# Patient Record
Sex: Female | Born: 1985 | Race: Black or African American | Hispanic: No | Marital: Single | Smoking: Never smoker
Health system: Southern US, Community
[De-identification: ages and names within clinical notes are randomized; demographics above are authoritative.]

## PROBLEM LIST (undated history)

## (undated) DIAGNOSIS — G43909 Migraine, unspecified, not intractable, without status migrainosus: Secondary | ICD-10-CM

---

## 2009-01-10 ENCOUNTER — Emergency Department (HOSPITAL_COMMUNITY): Admission: EM | Admit: 2009-01-10 | Discharge: 2009-01-10 | Payer: Self-pay | Admitting: Emergency Medicine

## 2014-04-02 ENCOUNTER — Encounter (HOSPITAL_COMMUNITY): Payer: Self-pay | Admitting: Emergency Medicine

## 2014-04-02 ENCOUNTER — Emergency Department (HOSPITAL_COMMUNITY)
Admission: EM | Admit: 2014-04-02 | Discharge: 2014-04-02 | Disposition: A | Discharge status: Home / Self Care | Payer: BC Managed Care – PPO | Attending: Emergency Medicine | Admitting: Emergency Medicine

## 2014-04-02 ENCOUNTER — Emergency Department (HOSPITAL_COMMUNITY): Payer: BC Managed Care – PPO

## 2014-04-02 DIAGNOSIS — F17200 Nicotine dependence, unspecified, uncomplicated: Secondary | ICD-10-CM | POA: Insufficient documentation

## 2014-04-02 DIAGNOSIS — J069 Acute upper respiratory infection, unspecified: Secondary | ICD-10-CM | POA: Insufficient documentation

## 2014-04-02 DIAGNOSIS — R51 Headache: Secondary | ICD-10-CM | POA: Insufficient documentation

## 2014-04-02 DIAGNOSIS — Z8679 Personal history of other diseases of the circulatory system: Secondary | ICD-10-CM | POA: Insufficient documentation

## 2014-04-02 HISTORY — DX: Migraine, unspecified, not intractable, without status migrainosus: G43.909

## 2014-04-02 LAB — RAPID STREP SCREEN (MED CTR MEBANE ONLY): STREPTOCOCCUS, GROUP A SCREEN (DIRECT): NEGATIVE

## 2014-04-02 MED ORDER — IBUPROFEN 800 MG PO TABS
800.0000 mg | ORAL_TABLET | Freq: Once | ORAL | Status: AC
  Administered 2014-04-02: 800 mg via ORAL
  Filled 2014-04-02: qty 1

## 2014-04-02 NOTE — ED Notes (Signed)
Per pt, states cold symptoms since last Friday-productive cough-no relief with OTC meds

## 2014-04-02 NOTE — ED Provider Notes (Signed)
CSN: [id number]     Arrival date & time 04/02/14  0913 History   First MD Initiated Contact with Patient 04/02/14 [phone]     Chief Complaint  Patient presents with  . Sore Throat  . Cough     (Consider location/radiation/quality/duration/timing/severity/associated sxs/prior Treatment) Patient is a 28 y.o. female presenting with pharyngitis, cough, and URI. The history is provided by the patient.  Sore Throat This is a new problem. The current episode started more than 2 days ago. The problem occurs constantly. The problem has not changed since onset.Associated symptoms include headaches. Pertinent negatives include no chest pain, no abdominal pain and no shortness of breath. Nothing aggravates the symptoms. Nothing relieves the symptoms.  Cough Cough characteristics:  Productive Sputum characteristics:  Yellow Severity:  Mild Smoker: occasional - not daily.   Relieved by:  Nothing Worsened by:  Nothing tried Associated symptoms: headaches, rhinorrhea and sore throat   Associated symptoms: no chest pain, no fever and no shortness of breath   URI Presenting symptoms: congestion, cough, facial pain, rhinorrhea and sore throat   Presenting symptoms: no fever   Cough:    Cough characteristics:  Productive   Sputum characteristics:  Yellow   Severity:  Mild   Onset quality:  Gradual   Duration:  4 days   Timing:  Constant   Progression:  Unchanged   Chronicity:  New Associated symptoms: headaches     Past Medical History  Diagnosis Date  . Migraines    History reviewed. No pertinent past surgical history. No family history on file. History  Substance Use Topics  . Smoking status: Current Some Day Smoker  . Smokeless tobacco: Not on file  . Alcohol Use: Yes   OB History   Grav Para Term Preterm Abortions TAB SAB Ect Mult Living                 Review of Systems  Constitutional: Negative for fever.  HENT: Positive for congestion, rhinorrhea and sore throat.    Respiratory: Positive for cough. Negative for shortness of breath.   Cardiovascular: Negative for chest pain.  Gastrointestinal: Negative for abdominal pain.  Neurological: Positive for headaches.  All other systems reviewed and are negative.     Allergies  Review of patient's allergies indicates no known allergies.  Home Medications   Prior to Admission medications   Medication Sig Start Date End Date Taking? Authorizing Provider  acetaminophen (TYLENOL) 500 MG tablet Take 1,000-1,500 mg by mouth every 6 (six) hours as needed for moderate pain.   Yes Historical Provider, MD  ibuprofen (ADVIL,MOTRIN) 200 MG tablet Take 400 mg by mouth every 6 (six) hours as needed for mild pain.   Yes Historical Provider, MD   BP 114/77  Pulse 73  Temp(Src) 98.5 F (36.9 C) (Oral)  Resp 16  SpO2 100%  LMP 03/19/2014 Physical Exam  Nursing note and vitals reviewed. Constitutional: She is oriented to person, place, and time. She appears well-developed and well-nourished. No distress.  HENT:  Head: Normocephalic and atraumatic.  Right Ear: Tympanic membrane normal.  Left Ear: Tympanic membrane normal.  Nose: No mucosal edema, rhinorrhea or nasal septal hematoma. No epistaxis.  No foreign bodies. Right sinus exhibits maxillary sinus tenderness and frontal sinus tenderness. Left sinus exhibits maxillary sinus tenderness and frontal sinus tenderness.  Mouth/Throat: Oropharynx is clear and moist. No oropharyngeal exudate.  Eyes: EOM are normal. Pupils are equal, round, and reactive to light.  Neck: Normal range of motion. Neck supple.  Cardiovascular: Normal rate and regular rhythm.  Exam reveals no friction rub.   No murmur heard. Pulmonary/Chest: Effort normal and breath sounds normal. No respiratory distress. She has no wheezes. She has no rales.  Abdominal: Soft. She exhibits no distension. There is no tenderness. There is no rebound.  Musculoskeletal: Normal range of motion. She exhibits no  edema.  Neurological: She is alert and oriented to person, place, and time.  Skin: No rash noted. She is not diaphoretic.    ED Course  Procedures (including critical care time) Labs Review Labs Reviewed  RAPID STREP SCREEN    Imaging Review No results found.   EKG Interpretation None      MDM   Final diagnoses:  URI (upper respiratory infection)    53F presents with URI symptoms. Roommate with similar symptoms. No fevers. Rhinorrhea, sinus congestion, facial pain, mild productive cough. No SOB. Mild throat pain. AFVSS here. Lungs clear. MMM, oropharynx clear - no redness. TMs clear. Sinus tenderness on palpation. Likely viral sinusitis/URI. Given motrin, will check CXR. No need for antibiotics. Given resource guide for f/u.   Dagmar HaitWilliam Chrysta Fulcher, MD 04/02/14 1041

## 2014-04-02 NOTE — ED Notes (Signed)
Patient transported to X-ray 

## 2014-04-02 NOTE — Discharge Instructions (Signed)

## 2014-04-04 LAB — CULTURE, GROUP A STREP

## 2014-05-30 ENCOUNTER — Emergency Department (HOSPITAL_COMMUNITY)
Admission: EM | Admit: 2014-05-30 | Discharge: 2014-05-30 | Disposition: A | Discharge status: Home / Self Care | Payer: BC Managed Care – PPO | Attending: Emergency Medicine | Admitting: Emergency Medicine

## 2014-05-30 ENCOUNTER — Encounter (HOSPITAL_COMMUNITY): Payer: Self-pay | Admitting: Emergency Medicine

## 2014-05-30 DIAGNOSIS — R1084 Generalized abdominal pain: Secondary | ICD-10-CM | POA: Insufficient documentation

## 2014-05-30 DIAGNOSIS — F17200 Nicotine dependence, unspecified, uncomplicated: Secondary | ICD-10-CM | POA: Insufficient documentation

## 2014-05-30 DIAGNOSIS — R112 Nausea with vomiting, unspecified: Secondary | ICD-10-CM | POA: Insufficient documentation

## 2014-05-30 DIAGNOSIS — K644 Residual hemorrhoidal skin tags: Secondary | ICD-10-CM | POA: Insufficient documentation

## 2014-05-30 DIAGNOSIS — Z8679 Personal history of other diseases of the circulatory system: Secondary | ICD-10-CM | POA: Insufficient documentation

## 2014-05-30 DIAGNOSIS — Z791 Long term (current) use of non-steroidal anti-inflammatories (NSAID): Secondary | ICD-10-CM | POA: Insufficient documentation

## 2014-05-30 DIAGNOSIS — Z3202 Encounter for pregnancy test, result negative: Secondary | ICD-10-CM | POA: Insufficient documentation

## 2014-05-30 DIAGNOSIS — R197 Diarrhea, unspecified: Secondary | ICD-10-CM | POA: Insufficient documentation

## 2014-05-30 LAB — COMPREHENSIVE METABOLIC PANEL
ALT: 17 U/L (ref 0–35)
ANION GAP: 13 (ref 5–15)
AST: 19 U/L (ref 0–37)
Albumin: 5 g/dL (ref 3.5–5.2)
Alkaline Phosphatase: 56 U/L (ref 39–117)
BILIRUBIN TOTAL: 0.7 mg/dL (ref 0.3–1.2)
BUN: 13 mg/dL (ref 6–23)
CHLORIDE: 100 meq/L (ref 96–112)
CO2: 25 meq/L (ref 19–32)
CREATININE: 0.74 mg/dL (ref 0.50–1.10)
Calcium: 10.5 mg/dL (ref 8.4–10.5)
GLUCOSE: 92 mg/dL (ref 70–99)
Potassium: 4.2 mEq/L (ref 3.7–5.3)
Sodium: 138 mEq/L (ref 137–147)
Total Protein: 8.9 g/dL — ABNORMAL HIGH (ref 6.0–8.3)

## 2014-05-30 LAB — CBC WITH DIFFERENTIAL/PLATELET
Basophils Absolute: 0 10*3/uL (ref 0.0–0.1)
Basophils Relative: 0 % (ref 0–1)
Eosinophils Absolute: 0.3 10*3/uL (ref 0.0–0.7)
Eosinophils Relative: 4 % (ref 0–5)
HEMATOCRIT: 42.1 % (ref 36.0–46.0)
Hemoglobin: 14.5 g/dL (ref 12.0–15.0)
Lymphocytes Relative: 30 % (ref 12–46)
Lymphs Abs: 2.7 10*3/uL (ref 0.7–4.0)
MCH: 31.6 pg (ref 26.0–34.0)
MCHC: 34.4 g/dL (ref 30.0–36.0)
MCV: 91.7 fL (ref 78.0–100.0)
Monocytes Absolute: 0.4 10*3/uL (ref 0.1–1.0)
Monocytes Relative: 5 % (ref 3–12)
NEUTROS ABS: 5.5 10*3/uL (ref 1.7–7.7)
Neutrophils Relative %: 61 % (ref 43–77)
Platelets: 226 10*3/uL (ref 150–400)
RBC: 4.59 MIL/uL (ref 3.87–5.11)
RDW: 12 % (ref 11.5–15.5)
WBC: 9.1 10*3/uL (ref 4.0–10.5)

## 2014-05-30 LAB — URINALYSIS, ROUTINE W REFLEX MICROSCOPIC
Bilirubin Urine: NEGATIVE
Glucose, UA: NEGATIVE mg/dL
Hgb urine dipstick: NEGATIVE
KETONES UR: NEGATIVE mg/dL
Leukocytes, UA: NEGATIVE
NITRITE: NEGATIVE
PROTEIN: NEGATIVE mg/dL
Specific Gravity, Urine: 1.021 (ref 1.005–1.030)
UROBILINOGEN UA: 0.2 mg/dL (ref 0.0–1.0)
pH: 8 (ref 5.0–8.0)

## 2014-05-30 LAB — POC OCCULT BLOOD, ED: Fecal Occult Bld: POSITIVE — AB

## 2014-05-30 LAB — PREGNANCY, URINE: PREG TEST UR: NEGATIVE

## 2014-05-30 MED ORDER — ONDANSETRON HCL 4 MG/2ML IJ SOLN
4.0000 mg | Freq: Once | INTRAMUSCULAR | Status: AC
  Administered 2014-05-30: 4 mg via INTRAVENOUS
  Filled 2014-05-30: qty 2

## 2014-05-30 MED ORDER — SODIUM CHLORIDE 0.9 % IV BOLUS (SEPSIS)
1000.0000 mL | Freq: Once | INTRAVENOUS | Status: AC
  Administered 2014-05-30: 1000 mL via INTRAVENOUS

## 2014-05-30 MED ORDER — ONDANSETRON 4 MG PO TBDP: 4.0000 mg | ORAL_TABLET | Freq: Three times a day (TID) | ORAL | Status: AC | PRN

## 2014-05-30 NOTE — Discharge Instructions (Signed)
Follow up with your primary care doctor about your hospital visit. Continue to hydrate orally.Take all medications as prescribed & use Zofran as directed for nausea & vomiting.  Read the instructions below for reasons to return to the ER.  ° °The 'BRAT' diet is suggested, then progress to diet as tolerated as symptoms abate. Call if bloody stools, persistent diarrhea, vomiting, fever or abdominal pain. °Bananas.  °Rice.  °Applesauce.  °Toast (and other simple starches such as crackers, potatoes, noodles).  ° °SEEK IMMEDIATE MEDICAL ATTENTION IF: ° °You begin having localized abdominal pain that does not go away or becomes severe (The right side could  possibly be appendicitis. In an adult, the left lower portion of the abdomen could be colitis or diverticulitis) °  °A temperature above 101 develops ° °Repeated vomiting occurs (multiple uncontrollable episodes) or you are unable to keep fluids down ° °Blood is being passed in stools or vomit (bright red or black tarry stools).  ° °Return also if you develop chest pain, difficulty breathing, dizziness or fainting, or become confused, poorly responsive, or inconsolable (young children). ° ° °RESOURCE GUIDE ° °Dental Problems ° °Patients with Medicaid: °[facility]                     [facility] °[address]                                           [address] °Phone:  [phone]                                                  Phone:  510-2600 ° °If unable to pay or uninsured, contact:  [facility] or [facility] to become qualified for the adult dental clinic. ° °Chronic Pain Problems °Contact [facility]  [phone] °Patients need to be referred by their primary care doctor. ° °Insufficient Money for Medicine °Contact [employer]:  call "211" or [facility] 271-5999. ° °No Primary Care Doctor °Call [facility]  832-8000 °Other agencies that provide inexpensive medical care °   [facility]  [phone] °   [facility]  [phone] °   [facility]  [phone] °   [facility]  832-4777 °   [facility]  [phone] °   [facility]  [phone] ° °Psychological Services °[facility]  [phone] °[facility]  [phone] °[facility]   [phone] (emergency services 641-4993) ° °Substance Abuse Resources °[facility]  336-882-2125 °[facility] 336-784-9470 °[facility] 336-285-9073 °[facility] 336-845-3988 °Residential & Outpatient Substance Abuse Program  800-659-3381 ° °Abuse/Neglect °[place] [facility] (336) 641-3795 °[place] [facility] Hotline 800-378-5315 (After Hours) ° °Emergency Shelter ° [facility] (336) 271-5985 ° °Maternity Homes °[facility] (336) 275-9566 °[facility] (704) 372-4663 ° °MRSA Hotline #:   832-7006 ° ° ° °[place] [facility] ° °[facility] [place]     [employer]                            [facility] °[address] [place]                       [address]      [address]  °[place]                                                [place]                            [place] °Phone:  349-3220                                   Phone:  [phone]                 Phone:  342-8140 ° °[facility] °Phone:  342-8316 ° °[place] [facility]line °(336) 342-1394 °(336) 342-3537 (After Hours) ° ° ° ° °

## 2014-05-30 NOTE — Progress Notes (Signed)
  CARE MANAGEMENT ED NOTE 05/30/2014  Patient:  [name] Sullivan,[name]   Account Number:  [id number]  Date Initiated:  05/30/2014  Documentation initiated by:  Edd ArbourGIBBS,Evalina Tabak  Subjective/Objective Assessment:   28 yr [place] EPIC c/o vomiting, diarrhea and abdominal period x 1 week.  Pt states has noted blood on tissue and in toilet.  ? Hemorrhoid.  Pain with bm at rectum.     Subjective/Objective Assessment Detail:   Pt confirms she no longer has BCBS coverage Pt see by [facility] staff with resources and informed CM she would follow up on the resources already offered to her by [facility] staff  Pt appreciative of services offered     Action/Plan:   ED Cm spoke with pt about pcp, encouraged f[facility] with [facility]   Action/Plan Detail:   Anticipated DC Date:       Status Recommendation to Physician:   Result of Recommendation:    Other ED Services  Consult Working Plan    DC Planning Services  Other  PCP issues  Outpatient Services - Pt will follow up    Choice offered to / List presented to:            Status of service:  Completed, signed off  ED Comments:   ED Comments Detail:

## 2014-05-30 NOTE — ED Provider Notes (Signed)
CSN: [id number]     Arrival date & time 05/30/14  1228 History   None    Chief Complaint  Patient presents with  . Emesis  . Abdominal Pain     (Consider location/radiation/quality/duration/timing/severity/associated sxs/prior Treatment) HPI Comments: Patient presents today with a chief complaint of nausea, vomiting, diarrhea, and diffuse abdominal pain.  She reports that symptoms have been present for the past week.  She states that she has approximately 2-3 episodes of vomiting daily.  No blood in her emesis.  She reports that she was having 3-4 episodes of diarrhea daily, but this resolved 3 days ago.   She states that she has noticed a small amount of bright red blood while wiping after a BM the past 2 days.  She reports that she has also had diffuse abdominal pain.  She describes the pain as an "upset stomach" and also states that she has some cramping.  Pain does not radiate.  She denies fever, chills, urinary symptoms, or vaginal discharge.  She has not been taking anything for her symptoms.  She denies any known sick contacts or foreign travel.  Patient is a 28 y.o. female presenting with vomiting and abdominal pain. The history is provided by the patient.  Emesis Associated symptoms: abdominal pain and diarrhea   Associated symptoms: no chills   Abdominal Pain Associated symptoms: diarrhea, nausea and vomiting   Associated symptoms: no chills and no fever     Past Medical History  Diagnosis Date  . Migraines    History reviewed. No pertinent past surgical history. History reviewed. No pertinent family history. History  Substance Use Topics  . Smoking status: Current Some Day Smoker  . Smokeless tobacco: Not on file  . Alcohol Use: Yes   OB History   Grav Para Term Preterm Abortions TAB SAB Ect Mult Living                 Review of Systems  Constitutional: Negative for fever and chills.  Gastrointestinal: Positive for nausea, vomiting, abdominal pain, diarrhea and  blood in stool.  All other systems reviewed and are negative.     Allergies  Review of patient's allergies indicates no known allergies.  Home Medications   Prior to Admission medications   Medication Sig Start Date End Date Taking? Authorizing Provider  acetaminophen (TYLENOL) 500 MG tablet Take 1,000 mg by mouth every 6 (six) hours as needed for moderate pain.    Yes Historical Provider, MD  ibuprofen (ADVIL,MOTRIN) 200 MG tablet Take 400 mg by mouth every 6 (six) hours as needed for mild pain or moderate pain.    Yes Historical Provider, MD  naproxen sodium (ANAPROX) 220 MG tablet Take 660 mg by mouth 2 (two) times daily as needed (pain).   Yes Historical Provider, MD   BP 115/76  Pulse 97  Temp(Src) 98.4 F (36.9 C) (Oral)  Resp 16  SpO2 100%  LMP 04/07/2014 Physical Exam  Nursing note and vitals reviewed. Constitutional: She appears well-developed and well-nourished.  HENT:  Head: Normocephalic and atraumatic.  Mouth/Throat: Oropharynx is clear and moist.  Neck: Normal range of motion. Neck supple.  Cardiovascular: Normal rate, regular rhythm and normal heart sounds.   Pulmonary/Chest: Effort normal and breath sounds normal.  Abdominal: Soft. Bowel sounds are normal. She exhibits no distension and no mass. There is no rebound, no guarding and negative Murphy's sign.  Mild diffuse abdominal tenderness to palpation  Genitourinary: Rectum normal. Rectal exam shows no mass and no  tenderness.  Small non thrombosed external hemorrhoid Stool appears brown in color  Neurological: She is alert.  Skin: Skin is warm and dry.  Psychiatric: She has a normal mood and affect.    ED Course  Procedures (including critical care time) Labs Review Labs Reviewed  COMPREHENSIVE METABOLIC PANEL - Abnormal; Notable for the following:    Total Protein 8.9 (*)    All other components within normal limits  URINALYSIS, ROUTINE W REFLEX MICROSCOPIC - Abnormal; Notable for the following:     APPearance CLOUDY (*)    All other components within normal limits  CBC WITH DIFFERENTIAL  PREGNANCY, URINE  URINE MICROSCOPIC-ADD ON  POC OCCULT BLOOD, ED    Imaging Review No results found.   EKG Interpretation None     4:16 PM Reassessed patient.  She reports that her nausea has improved.  Will fluid challenge and reassess.  Repeat Abdominal Exam:  Abdomen soft and nontender. 4:49 PM Reassessed patient.  She is tolerating PO liquids. MDM   Final diagnoses:  None  Patient presenting with nausea, vomiting, diarrhea, and diffuse abdominal cramping.  Patient afebrile.  Labs unremarkable.  Urine pregnancy negative.  No localized abdominal pain on exam.  No rebound or guarding.  Therefore, doubt surgical abdomen.  Nausea improved after given IV Zofran.  Patient able to tolerate PO liquids.  Feel that the patient is stable for discharge.  Return precautions given.    Santiago Glad, PA-C 05/30/14 2230

## 2014-05-30 NOTE — Progress Notes (Signed)
[facility] provided pt with a list of primary care resources to help patient establish primary care.  °

## 2014-05-30 NOTE — ED Notes (Signed)
Pt states having vomiting, diarrhea and abdominal period x 1 week.  Pt states has noted blood on tissue and in toilet.  ? Hemorrhoid.  Pain with bm at rectum.

## 2014-05-31 NOTE — ED Provider Notes (Signed)
Medical screening examination/treatment/procedure(s) were performed by non-physician practitioner and as supervising physician I was immediately available for consultation/collaboration.   EKG Interpretation None       Linnie Delgrande T Ayoub Arey, MD 05/31/14 1311 

## 2014-10-15 ENCOUNTER — Encounter (HOSPITAL_COMMUNITY): Payer: Self-pay | Admitting: *Deleted

## 2014-10-16 ENCOUNTER — Ambulatory Visit (HOSPITAL_COMMUNITY)
Admission: RE | Admit: 2014-10-16 | Discharge: 2014-10-16 | Disposition: A | Discharge status: Home / Self Care | Payer: Self-pay | Source: Ambulatory Visit | Attending: Obstetrics and Gynecology | Admitting: Obstetrics and Gynecology

## 2014-10-16 ENCOUNTER — Encounter (HOSPITAL_COMMUNITY): Payer: Self-pay

## 2014-10-16 VITALS — BP 112/68 | Temp 98.7°F | Ht 67.5 in | Wt 114.6 lb

## 2014-10-16 DIAGNOSIS — Z1239 Encounter for other screening for malignant neoplasm of breast: Secondary | ICD-10-CM

## 2014-10-16 DIAGNOSIS — R87611 Atypical squamous cells cannot exclude high grade squamous intraepithelial lesion on cytologic smear of cervix (ASC-H): Secondary | ICD-10-CM

## 2014-10-16 HISTORY — DX: Migraine, unspecified, not intractable, without status migrainosus: G43.909

## 2014-10-16 NOTE — Patient Instructions (Addendum)
Explained to [name] Sullivan the follow-up needed for her abnormal Pap smear. [facility] for [facility] per recommendation to follow-up for abnormal Pap smear. Appointment scheduled for Thursday, October 24, 2014 at 1245. Patient aware of appointment and will be there. Informed patient she will need a screening mammogram at age 28 unless clinically indicated prior. [name] Sullivan verbalized understanding.  [name] Sullivan, [name] Maserhristine Poll, RN 8:51 AM

## 2014-10-16 NOTE — Progress Notes (Signed)
[facility] due to recommending a colposcopy to follow-up for abnormal Pap smear on 08/01/2014.  Pap Smear: Pap smear not completed today. [facility] and ASCUS cannot exclude HGSIL. Per patient her most recent Pap smear is the only abnormal Pap smear she has had. Last Pap smear result is scanned in EPIC under media.   Physical exam: Breasts Breasts symmetrical. No skin abnormalities bilateral breasts. No nipple retraction bilateral breasts. No nipple discharge bilateral breasts. No lymphadenopathy. No lumps palpated bilateral breasts. No complaints of pain or tenderness on exam. Screening mammogram recommended at age 28 unless clinically indicated prior.    Pelvic/Bimanual No Pap smear completed today since last Pap smear was 08/01/2014. Pap smear not indicated per [facility] guidelines.

## 2014-10-24 ENCOUNTER — Encounter: Payer: Self-pay | Admitting: Obstetrics & Gynecology

## 2014-10-24 ENCOUNTER — Ambulatory Visit (INDEPENDENT_AMBULATORY_CARE_PROVIDER_SITE_OTHER): Payer: Self-pay | Admitting: Obstetrics & Gynecology

## 2014-10-24 ENCOUNTER — Other Ambulatory Visit (HOSPITAL_COMMUNITY)
Admission: RE | Admit: 2014-10-24 | Discharge: 2014-10-24 | Disposition: A | Discharge status: Home / Self Care | Payer: Self-pay | Source: Ambulatory Visit | Attending: Obstetrics & Gynecology | Admitting: Obstetrics & Gynecology

## 2014-10-24 VITALS — BP 115/75 | HR 74 | Ht 67.5 in | Wt 115.8 lb

## 2014-10-24 DIAGNOSIS — R87611 Atypical squamous cells cannot exclude high grade squamous intraepithelial lesion on cytologic smear of cervix (ASC-H): Secondary | ICD-10-CM

## 2014-10-24 LAB — POCT PREGNANCY, URINE: PREG TEST UR: NEGATIVE

## 2014-10-24 NOTE — Progress Notes (Signed)
   Subjective:    Patient ID: [name] Sullivan, female    DOB: February 06, 1986, 28 y.o.   MRN: [id number]  HPI  This 28 yo lady is here for a colpo due to a pap showing ASCUS, cannot rule out HGSIL. She has been abstinent for about 2 months.   Review of Systems     Objective:   Physical Exam  UPT negative, consent signed, time out done Cervix prepped with acetic acid. Transformation zone seen in its entirety. Colpo adequate. acetowhite changes seen very lightly at the 3 o'clock position. This was biopsied. ECC obtained. She tolerated the procedure well.         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

## 2014-10-25 ENCOUNTER — Encounter: Payer: Self-pay | Admitting: *Deleted

## 2014-11-21 ENCOUNTER — Telehealth: Payer: Self-pay | Admitting: General Practice

## 2014-11-21 NOTE — Telephone Encounter (Signed)
Called patient, no answer- left message that we are trying to reach you with some results, please call us back at the clinics

## 2014-11-21 NOTE — Telephone Encounter (Signed)
-----   Message from Juliette MangleKelly Powell Rassette, RN sent at 11/21/2014  3:30 PM EST ----- Regarding: Please call Please call patient with colpo results and plan.  Please document in notes. Thanks, kelly ----- Message -----    From: Allie BossierMyra C Dove, MD    Sent: 11/20/2014   4:25 PM      To: Juliette MangleKelly Powell Rassette, RN Subject: RE: Need plan                                    Pap with cotesting in a year. Thanks ----- Message -----    From: Juliette MangleKelly Powell Rassette, RN    Sent: 11/15/2014   8:54 AM      To: Allie BossierMyra C Dove, MD Subject: Need plan                                      Patient had colpo with you on 10/24/14.  Path report -  Diagnosis 1. 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 2. Endocervix, curettage - BENIGN ENDOCERVICAL MUCOSA AND GLANDS.  Please advise regarding plan so patient can be notified. Thanks, Bed Bath & BeyondKelly

## 2014-11-26 ENCOUNTER — Encounter: Payer: Self-pay | Admitting: General Practice

## 2014-11-26 NOTE — Telephone Encounter (Addendum)
Called patient, no answer- left message that we are trying to reach you with some results, please call us back at the clinics. Will send letter

## 2015-01-01 ENCOUNTER — Encounter (HOSPITAL_COMMUNITY): Payer: Self-pay

## 2015-01-01 ENCOUNTER — Emergency Department (HOSPITAL_COMMUNITY)
Admission: EM | Admit: 2015-01-01 | Discharge: 2015-01-01 | Discharge status: Against Medical Advice | Payer: Self-pay | Attending: Emergency Medicine | Admitting: Emergency Medicine

## 2015-01-01 DIAGNOSIS — R197 Diarrhea, unspecified: Secondary | ICD-10-CM | POA: Insufficient documentation

## 2015-01-01 DIAGNOSIS — R112 Nausea with vomiting, unspecified: Secondary | ICD-10-CM | POA: Insufficient documentation

## 2015-01-01 DIAGNOSIS — R52 Pain, unspecified: Secondary | ICD-10-CM | POA: Insufficient documentation

## 2015-01-01 NOTE — ED Notes (Signed)
Patient reports N/V/D x 4 days.  C/o generalized body aches as well.

## 2015-01-01 NOTE — ED Notes (Signed)
Called pt for lab draw, no answer from pt

## 2015-01-01 NOTE — ED Notes (Signed)
Called from waiting room but did not answer. RN Gearldine BienenstockBrandy went to see if patient was in waiting room, but also didn't see.

## 2015-10-24 ENCOUNTER — Emergency Department (HOSPITAL_COMMUNITY)
Admission: EM | Admit: 2015-10-24 | Discharge: 2015-10-25 | Disposition: A | Discharge status: Home / Self Care | Payer: Self-pay | Attending: Emergency Medicine | Admitting: Emergency Medicine

## 2015-10-24 ENCOUNTER — Encounter (HOSPITAL_COMMUNITY): Payer: Self-pay | Admitting: *Deleted

## 2015-10-24 DIAGNOSIS — Z3202 Encounter for pregnancy test, result negative: Secondary | ICD-10-CM | POA: Insufficient documentation

## 2015-10-24 DIAGNOSIS — G43919 Migraine, unspecified, intractable, without status migrainosus: Secondary | ICD-10-CM

## 2015-10-24 DIAGNOSIS — R590 Localized enlarged lymph nodes: Secondary | ICD-10-CM | POA: Insufficient documentation

## 2015-10-24 DIAGNOSIS — K0889 Other specified disorders of teeth and supporting structures: Secondary | ICD-10-CM

## 2015-10-24 LAB — POC URINE PREG, ED: Preg Test, Ur: NEGATIVE

## 2015-10-24 MED ORDER — DEXAMETHASONE SODIUM PHOSPHATE 10 MG/ML IJ SOLN
10.0000 mg | Freq: Once | INTRAMUSCULAR | Status: AC
  Administered 2015-10-24: 10 mg via INTRAVENOUS
  Filled 2015-10-24: qty 1

## 2015-10-24 MED ORDER — KETOROLAC TROMETHAMINE 30 MG/ML IJ SOLN
30.0000 mg | Freq: Once | INTRAMUSCULAR | Status: AC
  Administered 2015-10-24: 30 mg via INTRAVENOUS
  Filled 2015-10-24: qty 1

## 2015-10-24 MED ORDER — METOCLOPRAMIDE HCL 5 MG/ML IJ SOLN
10.0000 mg | Freq: Once | INTRAMUSCULAR | Status: AC
  Administered 2015-10-24: 10 mg via INTRAVENOUS
  Filled 2015-10-24: qty 2

## 2015-10-24 MED ORDER — DIPHENHYDRAMINE HCL 50 MG/ML IJ SOLN
12.5000 mg | Freq: Once | INTRAMUSCULAR | Status: AC
  Administered 2015-10-24: 12.5 mg via INTRAVENOUS
  Filled 2015-10-24: qty 1

## 2015-10-24 NOTE — Discharge Instructions (Signed)
Migraine Headache A migraine headache is an intense, throbbing pain on one or both sides of your head. A migraine can last for 30 minutes to several hours. CAUSES  The exact cause of a migraine headache is not always known. However, a migraine may be caused when nerves in the brain become irritated and release chemicals that cause inflammation. This causes pain. Certain things may also trigger migraines, such as:  Alcohol.  Smoking.  Stress.  Menstruation.  Aged cheeses.  Foods or drinks that contain nitrates, glutamate, aspartame, or tyramine.  Lack of sleep.  Chocolate.  Caffeine.  Hunger.  Physical exertion.  Fatigue.  Medicines used to treat chest pain (nitroglycerine), birth control pills, estrogen, and some blood pressure medicines. SIGNS AND SYMPTOMS  Pain on one or both sides of your head.  Pulsating or throbbing pain.  Severe pain that prevents daily activities.  Pain that is aggravated by any physical activity.  Nausea, vomiting, or both.  Dizziness.  Pain with exposure to bright lights, loud noises, or activity.  General sensitivity to bright lights, loud noises, or smells. Before you get a migraine, you may get warning signs that a migraine is coming (aura). An aura may include:  Seeing flashing lights.  Seeing bright spots, halos, or zigzag lines.  Having tunnel vision or blurred vision.  Having feelings of numbness or tingling.  Having trouble talking.  Having muscle weakness. DIAGNOSIS  A migraine headache is often diagnosed based on:  Symptoms.  Physical exam.  A CT scan or MRI of your head. These imaging tests cannot diagnose migraines, but they can help rule out other causes of headaches. TREATMENT Medicines may be given for pain and nausea. Medicines can also be given to help prevent recurrent migraines.  HOME CARE INSTRUCTIONS  Only take over-the-counter or prescription medicines for pain or discomfort as directed by your  health care provider. The use of long-term narcotics is not recommended.  Lie down in a dark, quiet room when you have a migraine.  Keep a journal to find out what may trigger your migraine headaches. For example, write down:  What you eat and drink.  How much sleep you get.  Any change to your diet or medicines.  Limit alcohol consumption.  Quit smoking if you smoke.  Get 7-9 hours of sleep, or as recommended by your health care provider.  Limit stress.  Keep lights dim if bright lights bother you and make your migraines worse. SEEK IMMEDIATE MEDICAL CARE IF:   Your migraine becomes severe.  You have a fever.  You have a stiff neck.  You have vision loss.  You have muscular weakness or loss of muscle control.  You start losing your balance or have trouble walking.  You feel faint or pass out.  You have severe symptoms that are different from your first symptoms. MAKE SURE YOU:   Understand these instructions.  Will watch your condition.  Will get help right away if you are not doing well or get worse.   This information is not intended to replace advice given to you by your health care provider. Make sure you discuss any questions you have with your health care provider.   Follow up with oral surgery for wisdom tooth removal. Take home ibuprofen as needed for headache. Return to the ED if you experience severe headache, difficulty breathing or swallowing, facial swelling, fever, blurry vision, numbness/tingling in hand or feet.

## 2015-10-24 NOTE — ED Notes (Signed)
Ambulated patient to bathroom.

## 2015-10-24 NOTE — ED Notes (Signed)
Pt complains of migraine for more than a month. Pt states she believes the pain is from her wisdom teeth. Pt states she cannot keep any food down due to nausea/vomiting.

## 2015-10-25 NOTE — ED Provider Notes (Signed)
CSN: [id number]     Arrival date & time 10/24/15  2103 History   First MD Initiated Contact with Patient 10/24/15 2210     Chief Complaint  Patient presents with  . Migraine  . Nausea     (Consider location/radiation/quality/duration/timing/severity/associated sxs/prior Treatment) HPI   [name] Sullivan is a 29 y.o F with a pmhx of migraines who presents to the ED c/o headache. Pt states that she had been having a dull headache that has remained constant over the last 2 months. Pt believes that the headache is due to her wisdom teeth coming in because her back molars are also sore. Pt has associated nausea, no vomiting. Pt has been taking home tylenol with no relief. Denies blurry vision, numbness/tingling, facial swelling, difficulty breathing or swallowing. LMP was 10 days ago.   Past Medical History  Diagnosis Date  . Migraine    History reviewed. No pertinent past surgical history. Family History  Problem Relation Age of Onset  . Diabetes Mother   . Diabetes Maternal Grandfather   . Hypertension Maternal Grandfather    Social History  Substance Use Topics  . Smoking status: Never Smoker   . Smokeless tobacco: Never Used  . Alcohol Use: Yes     Comment: ocassionally   OB History    Gravida Para Term Preterm AB TAB SAB Ectopic Multiple Living   Review of Systems  All other systems reviewed and are negative.     Allergies  Review of patient's allergies indicates no known allergies.  Home Medications   Prior to Admission medications   Medication Sig Start Date End Date Taking? Authorizing Provider  aspirin-acetaminophen-caffeine (EXCEDRIN MIGRAINE) [phone] MG tablet Take 1 tablet by mouth every 6 (six) hours as needed for headache.   Yes Historical Provider, MD  ibuprofen (ADVIL,MOTRIN) 200 MG tablet Take 200 mg by mouth every 6 (six) hours as needed.   Yes Historical Provider, MD   BP 120/85 mmHg  Pulse 67  Temp(Src) 98.4 F (36.9 C)  (Oral)  Resp 16  SpO2 100%  LMP 10/09/2015 Physical Exam  Constitutional: She is oriented to person, place, and time. She appears well-developed and well-nourished. No distress.  HENT:  Head: Normocephalic and atraumatic.  Mouth/Throat: No oropharyngeal exudate.  Eyes: Conjunctivae and EOM are normal. Pupils are equal, round, and reactive to light. Right eye exhibits no discharge. Left eye exhibits no discharge. No scleral icterus.  Neck: Neck supple.  Cardiovascular: Normal rate, regular rhythm, normal heart sounds and intact distal pulses.  Exam reveals no gallop and no friction rub.   No murmur heard. Pulmonary/Chest: Effort normal and breath sounds normal. No respiratory distress. She has no wheezes. She has no rales. She exhibits no tenderness.  Abdominal: Soft. She exhibits no distension. There is no tenderness. There is no guarding.  Musculoskeletal: Normal range of motion. She exhibits no edema.  Lymphadenopathy:    She has cervical adenopathy.  Neurological: She is alert and oriented to person, place, and time. No cranial nerve deficit. She exhibits normal muscle tone.  Strength 5/5 throughout. No sensory deficits.  No gait abnormality. Normal finger to nose. Normal heel to shin.   Skin: Skin is warm and dry. No rash noted. She is not diaphoretic. No erythema. No pallor.  Psychiatric: She has a normal mood and affect. Her behavior is normal.  Nursing note and vitals reviewed.   ED Course  Procedures (including  critical care time) Labs Review Labs Reviewed  POC URINE PREG, ED    Imaging Review No results found. I have personally reviewed and evaluated these images and lab results as part of my medical decision-making.   EKG Interpretation None      MDM   Final diagnoses:  Intractable migraine without status migrainosus, unspecified migraine type  Pain, dental    Pt HA treated and improved while in ED with reglan, toradol, decadron and benadryl.  Presentation  is like pts typical HA and non concerning [facility], [facility], Meningitis, or temporal arteritis. Pain may possibly be contributed by ingrowing wisdom teeth. No gross abscess on exam. No evidence of ludwig's angina. Pregnancy test is negative. Pt is afebrile with no focal neuro deficits, nuchal rigidity, or change in vision. Pt is to follow up with PCP to discuss prophylactic medication. Oral surgeon referral also given. Pt verbalizes understanding and is agreeable with plan to dc. Return precautions outlined in patient discharge instructions.       Lester KinsmanSamantha Tripp LomaDowless, PA-C 10/25/15 2345  Mancel BaleElliott Wentz, MD 10/25/15 [phone]

## 2015-10-29 ENCOUNTER — Encounter (HOSPITAL_COMMUNITY): Payer: Self-pay | Admitting: *Deleted

## 2016-02-24 ENCOUNTER — Emergency Department (HOSPITAL_COMMUNITY): Payer: Self-pay

## 2016-02-24 ENCOUNTER — Emergency Department (HOSPITAL_COMMUNITY)
Admission: EM | Admit: 2016-02-24 | Discharge: 2016-02-24 | Disposition: A | Discharge status: Home / Self Care | Payer: Self-pay | Attending: Emergency Medicine | Admitting: Emergency Medicine

## 2016-02-24 ENCOUNTER — Encounter (HOSPITAL_COMMUNITY): Payer: Self-pay

## 2016-02-24 DIAGNOSIS — Z7982 Long term (current) use of aspirin: Secondary | ICD-10-CM | POA: Insufficient documentation

## 2016-02-24 DIAGNOSIS — R109 Unspecified abdominal pain: Secondary | ICD-10-CM | POA: Insufficient documentation

## 2016-02-24 DIAGNOSIS — R531 Weakness: Secondary | ICD-10-CM | POA: Insufficient documentation

## 2016-02-24 DIAGNOSIS — G43909 Migraine, unspecified, not intractable, without status migrainosus: Secondary | ICD-10-CM | POA: Insufficient documentation

## 2016-02-24 DIAGNOSIS — Z79899 Other long term (current) drug therapy: Secondary | ICD-10-CM | POA: Insufficient documentation

## 2016-02-24 DIAGNOSIS — N12 Tubulo-interstitial nephritis, not specified as acute or chronic: Secondary | ICD-10-CM | POA: Insufficient documentation

## 2016-02-24 LAB — PREGNANCY, URINE: PREG TEST UR: NEGATIVE

## 2016-02-24 LAB — URINALYSIS, ROUTINE W REFLEX MICROSCOPIC
BILIRUBIN URINE: NEGATIVE
GLUCOSE, UA: NEGATIVE mg/dL
KETONES UR: 15 mg/dL — AB
Nitrite: NEGATIVE
PROTEIN: 100 mg/dL — AB
Specific Gravity, Urine: 1.021 (ref 1.005–1.030)
pH: 5.5 (ref 5.0–8.0)

## 2016-02-24 LAB — I-STAT CHEM 8, ED
BUN: 10 mg/dL (ref 6–20)
CHLORIDE: 100 mmol/L — AB (ref 101–111)
Calcium, Ion: 1.04 mmol/L — ABNORMAL LOW (ref 1.12–1.23)
Creatinine, Ser: 1 mg/dL (ref 0.44–1.00)
GLUCOSE: 109 mg/dL — AB (ref 65–99)
HCT: 40 % (ref 36.0–46.0)
Hemoglobin: 13.6 g/dL (ref 12.0–15.0)
POTASSIUM: 4 mmol/L (ref 3.5–5.1)
Sodium: 134 mmol/L — ABNORMAL LOW (ref 135–145)
TCO2: 21 mmol/L (ref 0–100)

## 2016-02-24 LAB — URINE MICROSCOPIC-ADD ON: RBC / HPF: NONE SEEN RBC/hpf (ref 0–5)

## 2016-02-24 MED ORDER — ONDANSETRON HCL 4 MG PO TABS: 4.0000 mg | ORAL_TABLET | Freq: Four times a day (QID) | ORAL | Status: AC

## 2016-02-24 MED ORDER — OXYCODONE-ACETAMINOPHEN 5-325 MG PO TABS
1.0000 | ORAL_TABLET | Freq: Once | ORAL | Status: AC
  Administered 2016-02-24: 1 via ORAL
  Filled 2016-02-24: qty 1

## 2016-02-24 MED ORDER — METOCLOPRAMIDE HCL 5 MG/ML IJ SOLN
10.0000 mg | Freq: Once | INTRAMUSCULAR | Status: AC
  Administered 2016-02-24: 10 mg via INTRAVENOUS
  Filled 2016-02-24: qty 2

## 2016-02-24 MED ORDER — HYDROCODONE-ACETAMINOPHEN 5-325 MG PO TABS: 1.0000 | ORAL_TABLET | ORAL | Status: AC | PRN

## 2016-02-24 MED ORDER — SODIUM CHLORIDE 0.9 % IV BOLUS (SEPSIS)
1000.0000 mL | Freq: Once | INTRAVENOUS | Status: AC
  Administered 2016-02-24: 1000 mL via INTRAVENOUS

## 2016-02-24 MED ORDER — DEXAMETHASONE SODIUM PHOSPHATE 10 MG/ML IJ SOLN
10.0000 mg | Freq: Once | INTRAMUSCULAR | Status: AC
  Administered 2016-02-24: 10 mg via INTRAVENOUS
  Filled 2016-02-24: qty 1

## 2016-02-24 MED ORDER — CEPHALEXIN 500 MG PO CAPS: ORAL_CAPSULE | ORAL | Status: AC

## 2016-02-24 MED ORDER — ONDANSETRON HCL 4 MG/2ML IJ SOLN
4.0000 mg | Freq: Once | INTRAMUSCULAR | Status: AC
  Administered 2016-02-24: 4 mg via INTRAVENOUS
  Filled 2016-02-24: qty 2

## 2016-02-24 MED ORDER — FENTANYL CITRATE (PF) 100 MCG/2ML IJ SOLN
50.0000 ug | Freq: Once | INTRAMUSCULAR | Status: AC
  Administered 2016-02-24: 50 ug via INTRAVENOUS
  Filled 2016-02-24: qty 2

## 2016-02-24 MED ORDER — KETOROLAC TROMETHAMINE 30 MG/ML IJ SOLN
30.0000 mg | Freq: Once | INTRAMUSCULAR | Status: AC
  Administered 2016-02-24: 30 mg via INTRAVENOUS
  Filled 2016-02-24: qty 1

## 2016-02-24 MED ORDER — DIPHENHYDRAMINE HCL 50 MG/ML IJ SOLN
25.0000 mg | Freq: Once | INTRAMUSCULAR | Status: AC
  Administered 2016-02-24: 25 mg via INTRAVENOUS
  Filled 2016-02-24: qty 1

## 2016-02-24 MED ORDER — CEFTRIAXONE SODIUM 1 G IJ SOLR
1.0000 g | Freq: Once | INTRAMUSCULAR | Status: AC
  Administered 2016-02-24: 1 g via INTRAVENOUS
  Filled 2016-02-24: qty 10

## 2016-02-24 NOTE — ED Provider Notes (Signed)
CSN: [id number]     Arrival date & time 02/24/16  1121 History   First MD Initiated Contact with Patient 02/24/16 1131     Chief Complaint  Patient presents with  . Dysuria  . Headache     (Consider location/radiation/quality/duration/timing/severity/associated sxs/prior Treatment) Patient is a 30 y.o. female presenting with dysuria.  Dysuria Pain quality:  Aching, sharp and shooting Pain severity:  Mild Onset quality:  Sudden Timing:  Constant Progression:  Worsening Chronicity:  New Recent urinary tract infections: no   Relieved by:  None tried Worsened by:  Nothing tried Ineffective treatments:  None tried Urinary symptoms: discolored urine, frequent urination and hematuria   Urinary symptoms: no bladder incontinence   Associated symptoms: fever, flank pain, nausea and vomiting   Associated symptoms: no abdominal pain     Past Medical History  Diagnosis Date  . Migraines   . Migraine    History reviewed. No pertinent past surgical history. Family History  Problem Relation Age of Onset  . Diabetes Mother   . Diabetes Maternal Grandfather   . Hypertension Maternal Grandfather    Social History  Substance Use Topics  . Smoking status: Never Smoker   . Smokeless tobacco: Never Used  . Alcohol Use: Yes     Comment: ocassionally   OB History    Gravida Para Term Preterm AB TAB SAB Ectopic Multiple Living   1 0 0 0 1 0 1 0       Review of Systems  Constitutional: Positive for fever.  Gastrointestinal: Positive for nausea and vomiting. Negative for abdominal pain.  Genitourinary: Positive for dysuria and flank pain.  Neurological: Positive for weakness and headaches.  All other systems reviewed and are negative.     Allergies  Review of patient's allergies indicates no known allergies.  Home Medications   Prior to Admission medications   Medication Sig Start Date End Date Taking? Authorizing Provider  acetaminophen (TYLENOL) 500 MG tablet Take 1,000 mg  by mouth every 6 (six) hours as needed for moderate pain.    Yes Historical Provider, MD  alum & mag hydroxide-simeth (MAALOX/MYLANTA) 200-200-20 MG/5ML suspension Take 15 mLs by mouth every 6 (six) hours as needed for indigestion or heartburn.   Yes Historical Provider, MD  aspirin-acetaminophen-caffeine (EXCEDRIN MIGRAINE) [phone] MG tablet Take 1 tablet by mouth every 6 (six) hours as needed for headache.   Yes Historical Provider, MD  cephALEXin (KEFLEX) 500 MG capsule 2 caps po bid x 7 days 02/24/16   Marily MemosJason Abijah Roussel, MD  famotidine (PEPCID AC) 10 MG chewable tablet Chew 10 mg by mouth daily as needed for heartburn.   Yes Historical Provider, MD  HYDROcodone-acetaminophen (NORCO) 5-325 MG tablet Take 1-2 tablets by mouth every 4 (four) hours as needed. 02/24/16   Marily MemosJason Brittainy Bucker, MD  ibuprofen (ADVIL,MOTRIN) 200 MG tablet Take 400 mg by mouth every 6 (six) hours as needed for mild pain or moderate pain.    Yes Historical Provider, MD  ondansetron (ZOFRAN ODT) 4 MG disintegrating tablet Take 1 tablet (4 mg total) by mouth every 8 (eight) hours as needed for nausea or vomiting. 05/30/14  Yes Heather Laisure, PA-C  ondansetron (ZOFRAN) 4 MG tablet Take 1 tablet (4 mg total) by mouth every 6 (six) hours. 02/24/16   Barbara CowerJason Drenda Sobecki, MD   BP 106/66 mmHg  Pulse 86  Temp(Src) 100.4 F (38 C) (Oral)  Resp 15  SpO2 100%  LMP 02/07/2016 Physical Exam  Constitutional: She is oriented to person, place,  and time. She appears well-developed and well-nourished.  HENT:  Head: Normocephalic and atraumatic.  Mouth/Throat: Mucous membranes are dry.  Neck: Normal range of motion.  Cardiovascular: Regular rhythm.  Tachycardia present.   Pulmonary/Chest: Effort normal and breath sounds normal. No stridor. No respiratory distress.  Abdominal: Soft. She exhibits no distension. There is no tenderness.  Musculoskeletal: Normal range of motion. She exhibits no edema or tenderness.  Neurological: She is alert and oriented  to person, place, and time. No cranial nerve deficit.  Skin: Skin is warm and dry.  Nursing note and vitals reviewed.   ED Course  Procedures (including critical care time) Labs Review Labs Reviewed  URINALYSIS, ROUTINE W REFLEX MICROSCOPIC (NOT AT [facility]) - Abnormal; Notable for the following:    APPearance TURBID (*)    Hgb urine dipstick SMALL (*)    Ketones, ur 15 (*)    Protein, ur 100 (*)    Leukocytes, UA LARGE (*)    All other components within normal limits  URINE MICROSCOPIC-ADD ON - Abnormal; Notable for the following:    Squamous Epithelial / LPF 0-5 (*)    Bacteria, UA RARE (*)    All other components within normal limits  I-STAT CHEM 8, ED - Abnormal; Notable for the following:    Sodium 134 (*)    Chloride 100 (*)    Glucose, Bld 109 (*)    Calcium, Ion 1.04 (*)    All other components within normal limits  PREGNANCY, URINE    Imaging Review Ct Renal Stone Study  02/24/2016  CLINICAL DATA:  Left side abdominal pain with dysuria since last Wednesday. EXAM: CT ABDOMEN AND PELVIS WITHOUT CONTRAST TECHNIQUE: Multidetector CT imaging of the abdomen and pelvis was performed following the standard protocol without IV contrast. COMPARISON:  None. FINDINGS: Lower chest:  Unremarkable. Hepatobiliary: No focal abnormality in the liver on this study without intravenous contrast. No evidence of hepatomegaly. There is no evidence for gallstones, gallbladder wall thickening, or pericholecystic fluid. No intrahepatic or extrahepatic biliary dilation. Pancreas: No focal mass lesion. No dilatation of the main duct. No intraparenchymal cyst. No peripancreatic edema. Spleen: No splenomegaly. No focal mass lesion. Adrenals/Urinary Tract: No adrenal nodule or mass. Kidneys are normal in appearance. The right kidney is normal in appearance. Left kidney shows mild fullness of the intrarenal collecting system. There is perinephric edema around the mid and lower kidney with associated perienteric  edema proximally. No evidence for left renal or ureteral stone. No bladder stone. Stomach/Bowel: Stomach is nondistended. No gastric wall thickening. No evidence of outlet obstruction. Duodenum is normally positioned as is the ligament of Treitz. No small bowel wall thickening. No small bowel dilatation. The terminal ileum is normal. The appendix is normal. No gross colonic mass. No colonic wall thickening. No substantial diverticular change. Vascular/Lymphatic: No abdominal aortic aneurysm. No abdominal aortic atherosclerotic calcification. There is no gastrohepatic or hepatoduodenal ligament lymphadenopathy. No intraperitoneal or retroperitoneal lymphadenopathy. No pelvic sidewall lymphadenopathy. Reproductive: The uterus has normal CT imaging appearance. 6.1 x 3.9 cm cystic mass is identified in the left adnexal space. Under opacified small bowel obscures the right adnexal region. Other: Trace intraperitoneal free fluid is evident. Musculoskeletal: Bone windows reveal no worrisome lytic or sclerotic osseous lesions. IMPRESSION: 1. Secondary changes are noted in the left kidney and proximal left ureter without etiology evident by CT. The perinephric and periureteric edema may be related to recent stone passage although pyelonephritis could also produce this appearance. Given patient age, it urothelial  neoplasm is considered unlikely. 2. 6.1 cm left adnexal cyst. Given the history of left-sided pain, pelvic ultrasound may prove helpful to further evaluate. Electronically Signed   By: Kennith Center M.D.   On: 02/24/2016 13:43   I have personally reviewed and evaluated these images and lab results as part of my medical decision-making.   EKG Interpretation None      MDM   Final diagnoses:  Pyelonephritis   Likely early pyelo. Will eval fro kidney stone as she has radiating pain as well.   CT and labs c/w pyelo. Tolerating PO. Improved headache with headache cocktail, likely 2/2 dehydration. Will dc on  abx.   New Prescriptions: Discharge Medication List as of 02/24/2016  2:38 PM    START taking these medications   Details  cephALEXin (KEFLEX) 500 MG capsule 2 caps po bid x 7 days, Print    HYDROcodone-acetaminophen (NORCO) 5-325 MG tablet Take 1-2 tablets by mouth every 4 (four) hours as needed., Starting 02/24/2016, Until Discontinued, Print    ondansetron (ZOFRAN) 4 MG tablet Take 1 tablet (4 mg total) by mouth every 6 (six) hours., Starting 02/24/2016, Until Discontinued, Print         I have personally and contemperaneously reviewed labs and imaging and used in my decision making as above.   A medical screening exam was performed and I feel the patient has had an appropriate workup for their chief complaint at this time and likelihood of emergent condition existing is low. Their vital signs are stable. They have been counseled on decision, discharge, follow up and which symptoms necessitate immediate return to the emergency department.  They verbally stated understanding and agreement with plan and discharged in stable condition.      Marily Memos, MD 02/25/16 [phone]

## 2016-02-24 NOTE — ED Notes (Signed)
Pt with burning and odor urine since last Wednesday.  Also having headache x 3 days.  Low grade fever.

## 2017-12-28 IMAGING — CT CT RENAL STONE PROTOCOL
2 of 3 series · 15 of 46 positions shown, 17 images · non-contrast
Comparison: None.

CLINICAL DATA: Left side abdominal pain with dysuria since last
[REDACTED].

EXAM:
CT ABDOMEN AND PELVIS WITHOUT CONTRAST
TECHNIQUE: Multidetector CT imaging of the abdomen and pelvis was performed
following the standard protocol without IV contrast.

[Series 4: lung · axial · 0.74mm/px · z∈[+136,+226]mm · 12 of 53 slices shown, 14 images]
[im 4/53  soft-tissue]
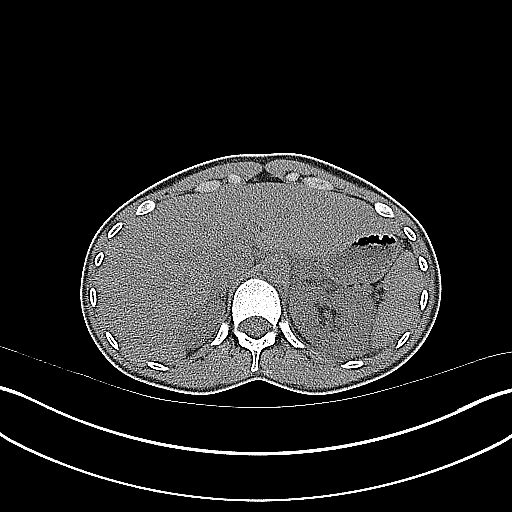
[im 4/53  bone]
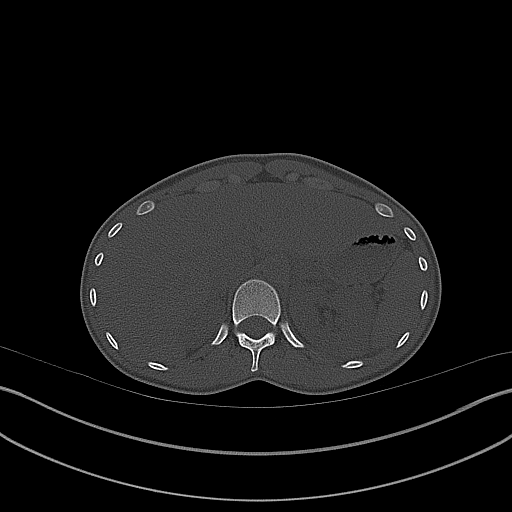
[im 7/53  soft-tissue]
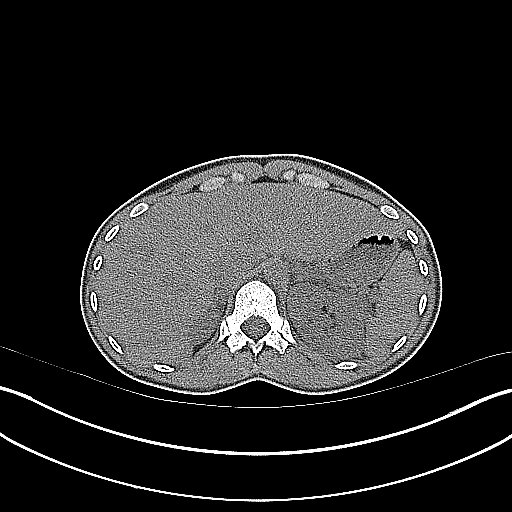
[im 12/53  soft-tissue]
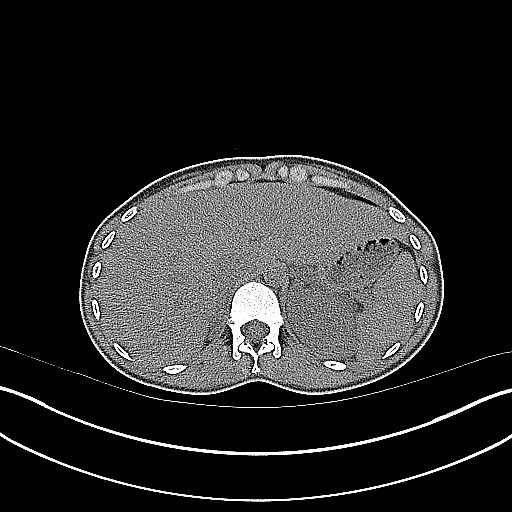
[im 16/53  soft-tissue]
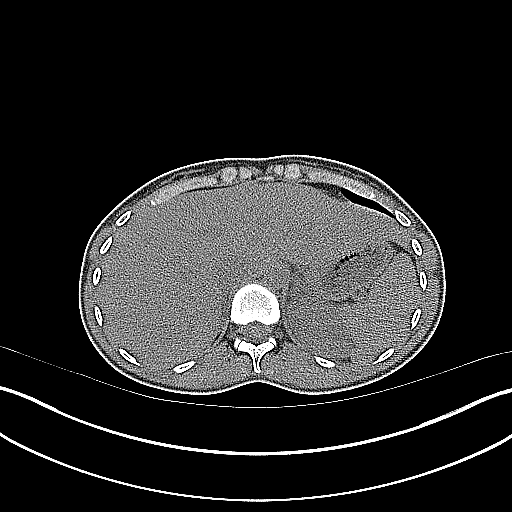
[im 21/53  soft-tissue]
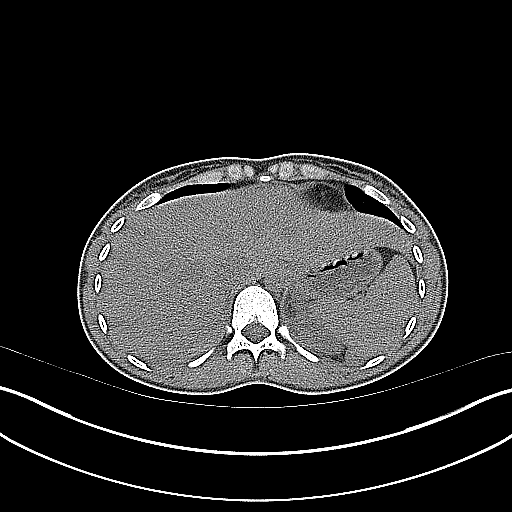
[im 24/53  soft-tissue]
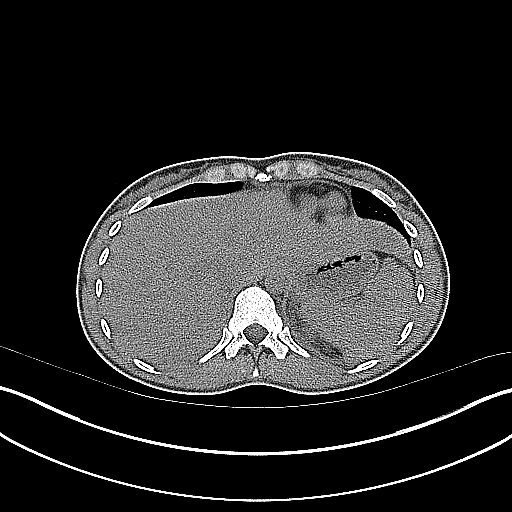
[im 29/53  soft-tissue]
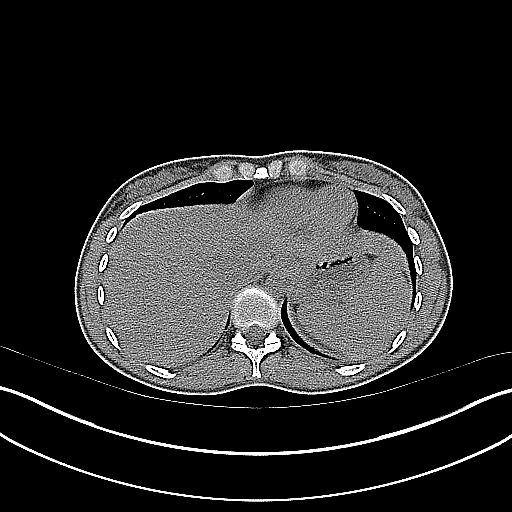
[im 32/53  soft-tissue]
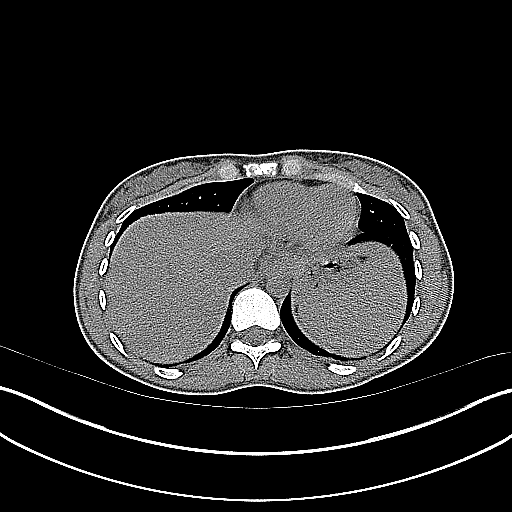
[im 37/53  soft-tissue]
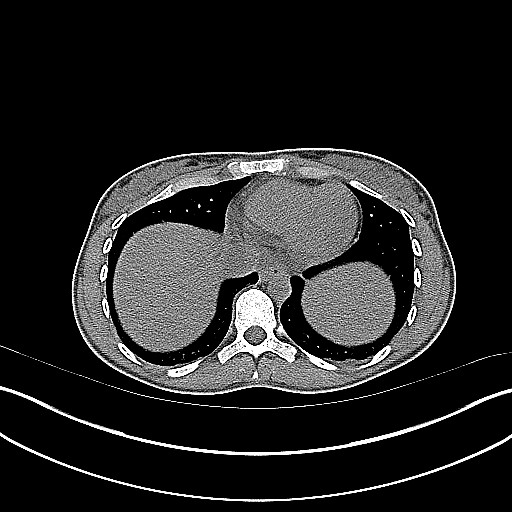
[im 37/53  bone]
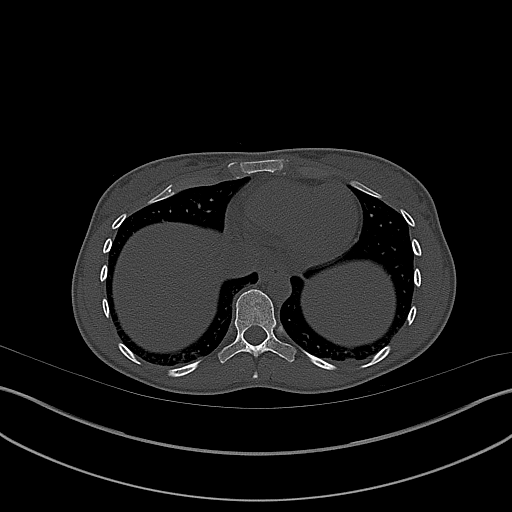
[im 41/53  soft-tissue]
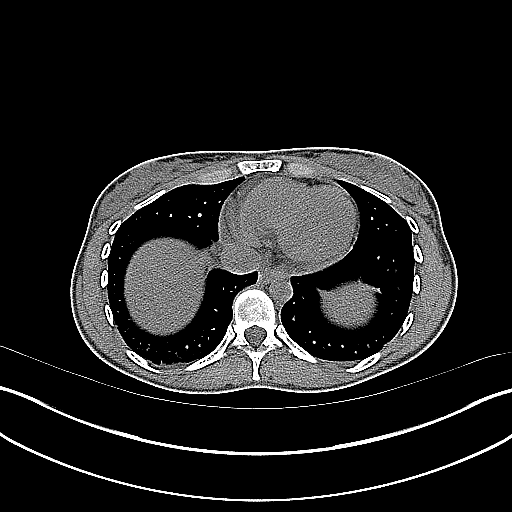
[im 46/53  soft-tissue]
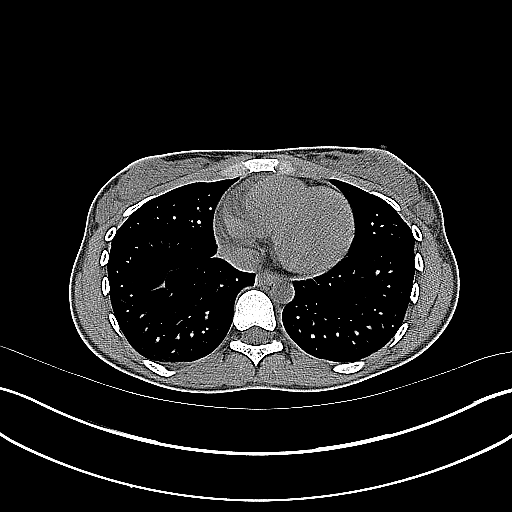
[im 49/53  soft-tissue]
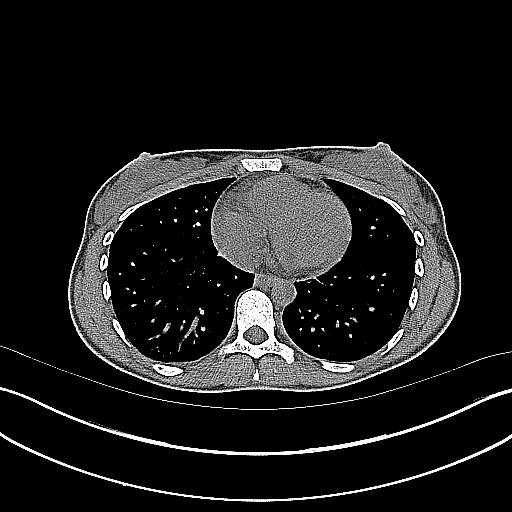

[Series 5: coronal · coronal · 0.63mm/px · 3 of 107 slices shown]
[im 36/107  soft-tissue]
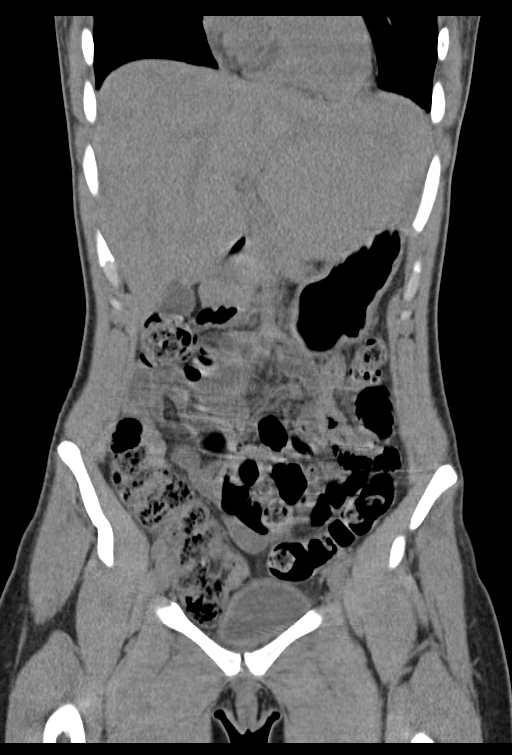
[im 48/107  soft-tissue]
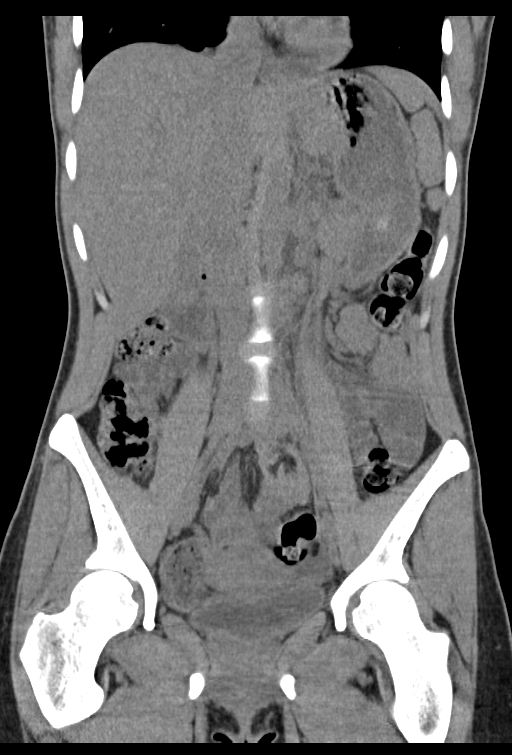
[im 59/107  soft-tissue]
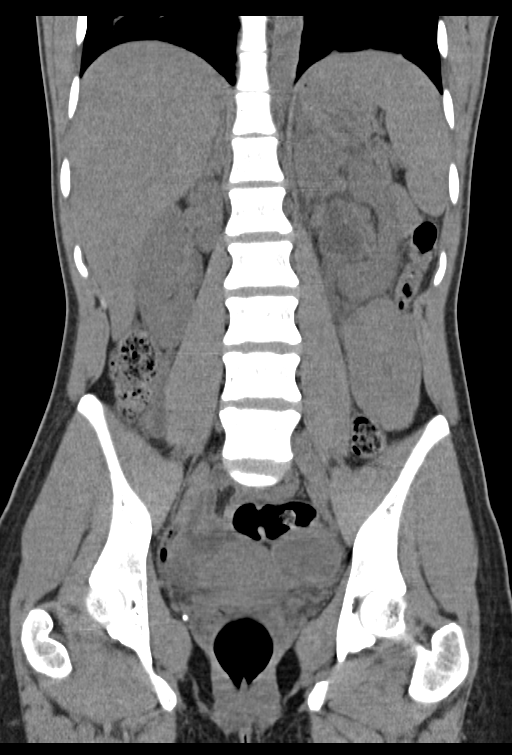

[15 of 46 positions shown; findings below may reference images not displayed]

FINDINGS: Lower chest:  Unremarkable.

Hepatobiliary: No focal abnormality in the liver on this study
without intravenous contrast. No evidence of hepatomegaly. There is
no evidence for gallstones, gallbladder wall thickening, or
pericholecystic fluid. No intrahepatic or extrahepatic biliary
dilation.

Pancreas: No focal mass lesion. No dilatation of the main duct. No
intraparenchymal cyst. No peripancreatic edema.

Spleen: No splenomegaly. No focal mass lesion.

Adrenals/Urinary Tract: No adrenal nodule or mass. Kidneys are
normal in appearance. The right kidney is normal in appearance. Left
kidney shows mild fullness of the intrarenal collecting system.
There is perinephric edema around the mid and lower kidney with
associated perienteric edema proximally. No evidence for left renal
or ureteral stone. No bladder stone.

Stomach/Bowel: Stomach is nondistended. No gastric wall thickening.
No evidence of outlet obstruction. Duodenum is normally positioned
as is the ligament of Treitz. No small bowel wall thickening. No
small bowel dilatation. The terminal ileum is normal. The appendix
is normal. No gross colonic mass. No colonic wall thickening. No
substantial diverticular change.

Vascular/Lymphatic: No abdominal aortic aneurysm. No abdominal
aortic atherosclerotic calcification. There is no gastrohepatic or
hepatoduodenal ligament lymphadenopathy. No intraperitoneal or
retroperitoneal lymphadenopathy. No pelvic sidewall lymphadenopathy.

Reproductive: The uterus has normal CT imaging appearance. 6.1 x
cm cystic mass is identified in the left adnexal space. Under
opacified small bowel obscures the right adnexal region.

Other: Trace intraperitoneal free fluid is evident.

Musculoskeletal: Bone windows reveal no worrisome lytic or sclerotic
osseous lesions.
IMPRESSION: 1. Secondary changes are noted in the left kidney and proximal left
ureter without etiology evident by CT. The perinephric and
periureteric edema may be related to recent stone passage although
pyelonephritis could also produce this appearance. Given patient
age, it urothelial neoplasm is considered unlikely.
2. 6.1 cm left adnexal cyst. Given the history of left-sided pain,
pelvic ultrasound may prove helpful to further evaluate.

## 2021-08-25 ENCOUNTER — Encounter (HOSPITAL_BASED_OUTPATIENT_CLINIC_OR_DEPARTMENT_OTHER): Payer: Self-pay | Admitting: *Deleted

## 2021-08-25 ENCOUNTER — Emergency Department (HOSPITAL_BASED_OUTPATIENT_CLINIC_OR_DEPARTMENT_OTHER)
Admission: EM | Admit: 2021-08-25 | Discharge: 2021-08-25 | Disposition: A | Discharge status: Home / Self Care | Payer: BLUE CROSS/BLUE SHIELD | Attending: Emergency Medicine | Admitting: Emergency Medicine

## 2021-08-25 ENCOUNTER — Other Ambulatory Visit: Payer: Self-pay

## 2021-08-25 DIAGNOSIS — J069 Acute upper respiratory infection, unspecified: Secondary | ICD-10-CM | POA: Insufficient documentation

## 2021-08-25 DIAGNOSIS — R059 Cough, unspecified: Secondary | ICD-10-CM | POA: Diagnosis present

## 2021-08-25 MED ORDER — DEXAMETHASONE 4 MG PO TABS
10.0000 mg | ORAL_TABLET | Freq: Once | ORAL | Status: AC
  Administered 2021-08-25: 10 mg via ORAL
  Filled 2021-08-25: qty 3

## 2021-08-25 NOTE — ED Triage Notes (Signed)
Cough, runny nose, sore throat and congestion for a week.

## 2021-08-25 NOTE — ED Provider Notes (Signed)
[facility] EMERGENCY DEPARTMENT Provider Note   CSN: [id number] Arrival date & time: 08/25/21  1341     History Chief Complaint  Patient presents with   URI    [name] Sullivan is a 35 y.o. female.  35 year old female presents with complaint of cough, congestion, runny nose.  Symptoms started about 5 days ago, exposed to her son who has tested positive for RSV.  Patient is taking Delsym and Mucinex with minimal relief of her symptoms.  Patient is otherwise healthy.  No other complaints or concerns      Past Medical History:  Diagnosis Date   Migraine    Migraines     There are no problems to display for this patient.   History reviewed. No pertinent surgical history.   OB History     Gravida  1   Para  0   Term  0   Preterm  0   AB  1   Living         SAB  1   IAB  0   Ectopic  0   Multiple      Live Births              Family History  Problem Relation Age of Onset   Diabetes Mother    Diabetes Maternal Grandfather    Hypertension Maternal Grandfather     Social History   Tobacco Use   Smoking status: Never   Smokeless tobacco: Never  Vaping Use   Vaping Use: Never used  Substance Use Topics   Alcohol use: Yes    Comment: ocassionally   Drug use: No    Home Medications Prior to Admission medications   Medication Sig Start Date End Date Taking? Authorizing Provider  acetaminophen (TYLENOL) 500 MG tablet Take 1,000 mg by mouth every 6 (six) hours as needed for moderate pain.     [provider]  alum & mag hydroxide-simeth (MAALOX/MYLANTA) 200-200-20 MG/5ML suspension Take 15 mLs by mouth every 6 (six) hours as needed for indigestion or heartburn.    [provider]  aspirin-acetaminophen-caffeine (EXCEDRIN MIGRAINE) [phone] MG tablet Take 1 tablet by mouth every 6 (six) hours as needed for headache.    [provider]  cephALEXin (KEFLEX) 500 MG capsule 2 caps po bid x 7 days 02/24/16    Mesner, [name], MD  famotidine (PEPCID AC) 10 MG chewable tablet Chew 10 mg by mouth daily as needed for heartburn.    [provider]  HYDROcodone-acetaminophen (NORCO) 5-325 MG tablet Take 1-2 tablets by mouth every 4 (four) hours as needed. 02/24/16   Mesner, [name], MD  ibuprofen (ADVIL,MOTRIN) 200 MG tablet Take 400 mg by mouth every 6 (six) hours as needed for mild pain or moderate pain.     [provider]  ondansetron (ZOFRAN ODT) 4 MG disintegrating tablet Take 1 tablet (4 mg total) by mouth every 8 (eight) hours as needed for nausea or vomiting. 05/30/14   Santiago Glad, PA-C  ondansetron (ZOFRAN) 4 MG tablet Take 1 tablet (4 mg total) by mouth every 6 (six) hours. 02/24/16   Mesner, [name], MD    Allergies    Patient has no known allergies.  Review of Systems   Review of Systems  Constitutional:  Negative for chills and fever.  HENT:  Positive for congestion, rhinorrhea, sinus pain and sore throat. Negative for ear pain.   Eyes:  Negative for redness.  Respiratory:  Positive for cough.   Skin:  Negative for rash and wound.  Allergic/Immunologic: Negative for immunocompromised state.  Neurological:  Positive for headaches.  Psychiatric/Behavioral:  Negative for confusion.   All other systems reviewed and are negative.  Physical Exam Updated Vital Signs BP 112/67 (BP Location: Left Arm)   Pulse 87   Temp 98.5 F (36.9 C) (Oral)   Resp 18   Ht 5\' 7"  (1.702 m)   Wt 54.4 kg   LMP 08/11/2021   SpO2 100%   BMI 18.78 kg/m   Physical Exam Vitals and nursing note reviewed.  Constitutional:      General: She is not in acute distress.    Appearance: She is well-developed. She is not diaphoretic.  HENT:     Head: Normocephalic and atraumatic.     Right Ear: Tympanic membrane and ear canal normal.     Left Ear: Tympanic membrane and ear canal normal.     Nose: Congestion present.     Right Sinus: No maxillary sinus tenderness or frontal sinus tenderness.      Left Sinus: No maxillary sinus tenderness or frontal sinus tenderness.     Mouth/Throat:     Mouth: Mucous membranes are moist.     Pharynx: No oropharyngeal exudate or posterior oropharyngeal erythema.  Eyes:     Conjunctiva/sclera: Conjunctivae normal.  Cardiovascular:     Rate and Rhythm: Normal rate and regular rhythm.     Heart sounds: Normal heart sounds.  Pulmonary:     Effort: Pulmonary effort is normal.     Breath sounds: Normal breath sounds.  Musculoskeletal:     Cervical back: Neck supple.  Lymphadenopathy:     Cervical: No cervical adenopathy.  Skin:    General: Skin is warm and dry.     Findings: No erythema or rash.  Neurological:     Mental Status: She is alert and oriented to person, place, and time.  Psychiatric:        Behavior: Behavior normal.    ED Results / Procedures / Treatments   Labs (all labs ordered are listed, but only abnormal results are displayed) Labs Reviewed - No data to display  EKG None  Radiology No results found.  Procedures Procedures   Medications Ordered in ED Medications  dexamethasone (DECADRON) tablet 10 mg (10 mg Oral Given 08/25/21 1616)    ED Course  I have reviewed the triage vital signs and the nursing notes.  Pertinent labs & imaging results that were available during my care of the patient were reviewed by me and considered in my medical decision making (see chart for details).  Clinical Course as of 08/25/21 1632  Tue Aug 25, 2021  [id number] 35 year old female with complaint of cough and congestion onset 5 days ago, exposed to her 34-year-old who was seen here and tested positive for RSV.  Patient appears to feel unwell although nontoxic and in no distress.  Vitals reviewed and reassuring including room air sat 100%.  Recommend saline sinus rinse, Zyrtec, Flonase, Sudafed.  Denies possibility of pregnancy, given dose of Decadron today to help with her congestion.  Recommend recheck with PCP as needed. [LM]     Clinical Course User Index [LM] 2   MDM Rules/Calculators/A&P                           Final Clinical Impression(s) / ED Diagnoses Final diagnoses:  Viral upper respiratory tract infection    Rx / DC Orders  ED Discharge Orders     None        Alden Hipp 08/25/21 1632    Tegeler, Canary Brim, MD 08/25/21 1757

## 2021-08-25 NOTE — Discharge Instructions (Addendum)
Discontinue Mucinex.  Recommend saline sinus rinse, Zyrtec and Flonase.  You can also take Sudafed (ask for this from the pharmacist).

## 2022-05-24 ENCOUNTER — Encounter (HOSPITAL_BASED_OUTPATIENT_CLINIC_OR_DEPARTMENT_OTHER): Payer: Self-pay

## 2022-05-24 ENCOUNTER — Emergency Department (HOSPITAL_BASED_OUTPATIENT_CLINIC_OR_DEPARTMENT_OTHER): Payer: Self-pay

## 2022-05-24 ENCOUNTER — Emergency Department (HOSPITAL_BASED_OUTPATIENT_CLINIC_OR_DEPARTMENT_OTHER)
Admission: EM | Admit: 2022-05-24 | Discharge: 2022-05-24 | Disposition: A | Discharge status: Home / Self Care | Payer: Self-pay | Attending: Emergency Medicine | Admitting: Emergency Medicine

## 2022-05-24 DIAGNOSIS — R001 Bradycardia, unspecified: Secondary | ICD-10-CM | POA: Insufficient documentation

## 2022-05-24 DIAGNOSIS — R079 Chest pain, unspecified: Secondary | ICD-10-CM

## 2022-05-24 DIAGNOSIS — R0789 Other chest pain: Secondary | ICD-10-CM | POA: Insufficient documentation

## 2022-05-24 DIAGNOSIS — R82998 Other abnormal findings in urine: Secondary | ICD-10-CM | POA: Insufficient documentation

## 2022-05-24 LAB — URINALYSIS, ROUTINE W REFLEX MICROSCOPIC
Bilirubin Urine: NEGATIVE
Glucose, UA: NEGATIVE mg/dL
Hgb urine dipstick: NEGATIVE
Ketones, ur: NEGATIVE mg/dL
Nitrite: NEGATIVE
Protein, ur: NEGATIVE mg/dL
Specific Gravity, Urine: 1.025 (ref 1.005–1.030)
pH: 6.5 (ref 5.0–8.0)

## 2022-05-24 LAB — BASIC METABOLIC PANEL
Anion gap: 7 (ref 5–15)
BUN: 12 mg/dL (ref 6–20)
CO2: 26 mmol/L (ref 22–32)
Calcium: 9.3 mg/dL (ref 8.9–10.3)
Chloride: 106 mmol/L (ref 98–111)
Creatinine, Ser: 0.86 mg/dL (ref 0.44–1.00)
GFR, Estimated: >60 mL/min (ref >60)
Glucose, Bld: 79 mg/dL (ref 70–99)
Potassium: 4 mmol/L (ref 3.5–5.1)
Sodium: 139 mmol/L (ref 135–145)

## 2022-05-24 LAB — CBC WITH DIFFERENTIAL/PLATELET
Abs Immature Granulocytes: 0.01 10*3/uL (ref 0.00–0.07)
Basophils Absolute: 0 10*3/uL (ref 0.0–0.1)
Basophils Relative: 1 %
Eosinophils Absolute: 0.2 10*3/uL (ref 0.0–0.5)
Eosinophils Relative: 4 %
HCT: 40.2 % (ref 36.0–46.0)
Hemoglobin: 13.5 g/dL (ref 12.0–15.0)
Immature Granulocytes: 0 %
Lymphocytes Relative: 46 %
Lymphs Abs: 2.5 10*3/uL (ref 0.7–4.0)
MCH: 31.8 pg (ref 26.0–34.0)
MCHC: 33.6 g/dL (ref 30.0–36.0)
MCV: 94.8 fL (ref 80.0–100.0)
Monocytes Absolute: 0.4 10*3/uL (ref 0.1–1.0)
Monocytes Relative: 7 %
Neutro Abs: 2.3 10*3/uL (ref 1.7–7.7)
Neutrophils Relative %: 42 %
Platelets: 198 10*3/uL (ref 150–400)
RBC: 4.24 MIL/uL (ref 3.87–5.11)
RDW: 11.8 % (ref 11.5–15.5)
WBC: 5.4 10*3/uL (ref 4.0–10.5)
nRBC: 0 % (ref 0.0–0.2)

## 2022-05-24 LAB — PREGNANCY, URINE: Preg Test, Ur: NEGATIVE

## 2022-05-24 LAB — URINALYSIS, MICROSCOPIC (REFLEX)

## 2022-05-24 LAB — TROPONIN I (HIGH SENSITIVITY): Troponin I (High Sensitivity): <2 ng/L (ref <18)

## 2022-05-24 NOTE — Discharge Instructions (Signed)
Take 4 over the counter ibuprofen tablets 3 times a day or 2 over-the-counter naproxen tablets twice a day for pain. Also take tylenol 1000mg(2 extra strength) four times a day.    

## 2022-05-24 NOTE — ED Notes (Signed)
Cp  left and central chest, that comes and goes for a few days states has just moved  lifting heavy objects, and has been under some stress , denies any N/V/Sob
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
## 2022-05-24 NOTE — ED Notes (Signed)
RN to room to d/c pt.  Pt requesting to speak to EDP once more prior to d/c to review results.  EDP Adela Lank made aware.

## 2023-12-13 ENCOUNTER — Emergency Department (HOSPITAL_BASED_OUTPATIENT_CLINIC_OR_DEPARTMENT_OTHER): Payer: Commercial Managed Care - PPO

## 2023-12-13 ENCOUNTER — Emergency Department (HOSPITAL_BASED_OUTPATIENT_CLINIC_OR_DEPARTMENT_OTHER)
Admission: EM | Admit: 2023-12-13 | Discharge: 2023-12-13 | Disposition: A | Discharge status: Home / Self Care | Payer: Commercial Managed Care - PPO | Attending: Emergency Medicine | Admitting: Emergency Medicine

## 2023-12-13 ENCOUNTER — Encounter (HOSPITAL_BASED_OUTPATIENT_CLINIC_OR_DEPARTMENT_OTHER): Payer: Self-pay

## 2023-12-13 DIAGNOSIS — O98511 Other viral diseases complicating pregnancy, first trimester: Secondary | ICD-10-CM | POA: Insufficient documentation

## 2023-12-13 DIAGNOSIS — J101 Influenza due to other identified influenza virus with other respiratory manifestations: Secondary | ICD-10-CM | POA: Diagnosis not present

## 2023-12-13 DIAGNOSIS — Z3A01 Less than 8 weeks gestation of pregnancy: Secondary | ICD-10-CM | POA: Diagnosis not present

## 2023-12-13 DIAGNOSIS — Z20822 Contact with and (suspected) exposure to covid-19: Secondary | ICD-10-CM | POA: Insufficient documentation

## 2023-12-13 DIAGNOSIS — O209 Hemorrhage in early pregnancy, unspecified: Secondary | ICD-10-CM | POA: Insufficient documentation

## 2023-12-13 DIAGNOSIS — O4690 Antepartum hemorrhage, unspecified, unspecified trimester: Secondary | ICD-10-CM

## 2023-12-13 LAB — URINALYSIS, MICROSCOPIC (REFLEX)

## 2023-12-13 LAB — CBC WITH DIFFERENTIAL/PLATELET
Abs Immature Granulocytes: 0.01 10*3/uL (ref 0.00–0.07)
Basophils Absolute: 0 10*3/uL (ref 0.0–0.1)
Basophils Relative: 1 %
Eosinophils Absolute: 0.1 10*3/uL (ref 0.0–0.5)
Eosinophils Relative: 1 %
HCT: 40.4 % (ref 36.0–46.0)
Hemoglobin: 13.6 g/dL (ref 12.0–15.0)
Immature Granulocytes: 0 %
Lymphocytes Relative: 30 %
Lymphs Abs: 1.5 10*3/uL (ref 0.7–4.0)
MCH: 30.8 pg (ref 26.0–34.0)
MCHC: 33.7 g/dL (ref 30.0–36.0)
MCV: 91.6 fL (ref 80.0–100.0)
Monocytes Absolute: 0.7 10*3/uL (ref 0.1–1.0)
Monocytes Relative: 15 %
Neutro Abs: 2.7 10*3/uL (ref 1.7–7.7)
Neutrophils Relative %: 53 %
Platelets: 163 10*3/uL (ref 150–400)
RBC: 4.41 MIL/uL (ref 3.87–5.11)
RDW: 12.3 % (ref 11.5–15.5)
WBC: 4.9 10*3/uL (ref 4.0–10.5)
nRBC: 0 % (ref 0.0–0.2)

## 2023-12-13 LAB — BASIC METABOLIC PANEL
Anion gap: 9 (ref 5–15)
BUN: 9 mg/dL (ref 6–20)
CO2: 20 mmol/L — ABNORMAL LOW (ref 22–32)
Calcium: 9.3 mg/dL (ref 8.9–10.3)
Chloride: 106 mmol/L (ref 98–111)
Creatinine, Ser: 0.82 mg/dL (ref 0.44–1.00)
GFR, Estimated: >60 mL/min (ref >60)
Glucose, Bld: 100 mg/dL — ABNORMAL HIGH (ref 70–99)
Potassium: 3.6 mmol/L (ref 3.5–5.1)
Sodium: 135 mmol/L (ref 135–145)

## 2023-12-13 LAB — URINALYSIS, ROUTINE W REFLEX MICROSCOPIC
Bilirubin Urine: NEGATIVE
Glucose, UA: NEGATIVE mg/dL
Ketones, ur: NEGATIVE mg/dL
Leukocytes,Ua: NEGATIVE
Nitrite: NEGATIVE
Protein, ur: NEGATIVE mg/dL
Specific Gravity, Urine: <=1.005 (ref 1.005–1.030)
pH: 6 (ref 5.0–8.0)

## 2023-12-13 LAB — HCG, QUANTITATIVE, PREGNANCY: hCG, Beta Chain, Quant, S: 48 m[IU]/mL — ABNORMAL HIGH (ref <5)

## 2023-12-13 LAB — RESP PANEL BY RT-PCR (RSV, FLU A&B, COVID)  RVPGX2
Influenza A by PCR: POSITIVE — AB
Influenza B by PCR: NEGATIVE
Resp Syncytial Virus by PCR: NEGATIVE
SARS Coronavirus 2 by RT PCR: NEGATIVE

## 2023-12-13 MED ORDER — ACETAMINOPHEN 325 MG PO TABS
650.0000 mg | ORAL_TABLET | Freq: Once | ORAL | Status: AC
  Administered 2023-12-13: 650 mg via ORAL
  Filled 2023-12-13: qty 2

## 2023-12-13 NOTE — ED Provider Notes (Signed)
 [place] EMERGENCY DEPARTMENT AT [facility] Provider Note   CSN: [id number] Arrival date & time: 12/13/23  9188     History  Chief Complaint  Patient presents with   URI   Vaginal Bleeding    [name] Sullivan is a 38 y.o. female.  With a history of migraines presenting to the ED for evaluation of flulike symptoms including subjective fevers, cough, body aches, headaches.  Symptoms began 2 days ago.  She has been taking children's Tylenol  with mild improvement.  Patient is also reporting vaginal bleeding.  States she is approximately [redacted] weeks pregnant.  Reports last menstrual period was 11/01/2023.  Bleeding began 2 days ago with spotting.  She passed a few clots yesterday.  She passed another clot this morning.  This is her second pregnancy.  She reports lower abdominal cramping as well.   URI Presenting symptoms: cough, fatigue and fever   Associated symptoms: headaches and myalgias   Vaginal Bleeding Associated symptoms: fatigue and fever        Home Medications Prior to Admission medications   Medication Sig Start Date End Date Taking? Authorizing Provider  acetaminophen  (TYLENOL ) 500 MG tablet Take 1,000 mg by mouth every 6 (six) hours as needed for moderate pain.     [provider]  alum & mag hydroxide-simeth (MAALOX/MYLANTA) 200-200-20 MG/5ML suspension Take 15 mLs by mouth every 6 (six) hours as needed for indigestion or heartburn.    [provider]  aspirin-acetaminophen -caffeine (EXCEDRIN MIGRAINE) 250-250-65 MG tablet Take 1 tablet by mouth every 6 (six) hours as needed for headache.    [provider]  cephALEXin  (KEFLEX ) 500 MG capsule 2 caps po bid x 7 days 02/24/16   Mesner, Selinda, MD  famotidine (PEPCID AC) 10 MG chewable tablet Chew 10 mg by mouth daily as needed for heartburn.    [provider]  HYDROcodone -acetaminophen  (NORCO) 5-325 MG tablet Take 1-2 tablets by mouth every 4 (four) hours as needed. 02/24/16    Mesner, Selinda, MD  ibuprofen  (ADVIL ,MOTRIN ) 200 MG tablet Take 400 mg by mouth every 6 (six) hours as needed for mild pain or moderate pain.     [provider]  ondansetron  (ZOFRAN  ODT) 4 MG disintegrating tablet Take 1 tablet (4 mg total) by mouth every 8 (eight) hours as needed for nausea or vomiting. 05/30/14   Nasario Moats, PA-C  ondansetron  (ZOFRAN ) 4 MG tablet Take 1 tablet (4 mg total) by mouth every 6 (six) hours. 02/24/16   Mesner, Selinda, MD      Allergies    Erythromycin    Review of Systems   Review of Systems  Constitutional:  Positive for fatigue and fever.  Respiratory:  Positive for cough.   Genitourinary:  Positive for vaginal bleeding.  Musculoskeletal:  Positive for myalgias.  Neurological:  Positive for headaches.  All other systems reviewed and are negative.   Physical Exam Updated Vital Signs BP 103/80   Pulse (!) 58   Temp 99 F (37.2 C)   Resp 16   Ht 5' 7 (1.702 m)   Wt 55.8 kg   LMP 11/01/2023 (Exact Date)   SpO2 100%   BMI 19.26 kg/m  Physical Exam Vitals and nursing note reviewed.  Constitutional:      General: She is not in acute distress.    Appearance: Normal appearance. She is normal weight. She is not ill-appearing.     Comments: Resting comfortably in bed  HENT:     Head: Normocephalic and  atraumatic.  Pulmonary:     Effort: Pulmonary effort is normal. No respiratory distress.     Breath sounds: No wheezing or rales.  Abdominal:     General: Abdomen is flat.     Palpations: Abdomen is soft.  Musculoskeletal:        General: Normal range of motion.     Cervical back: Neck supple.  Skin:    General: Skin is warm and dry.  Neurological:     Mental Status: She is alert and oriented to person, place, and time.  Psychiatric:        Mood and Affect: Mood normal.        Behavior: Behavior normal.     ED Results / Procedures / Treatments   Labs (all labs ordered are listed, but only abnormal results are  displayed) Labs Reviewed  RESP PANEL BY RT-PCR (RSV, FLU A&B, COVID)  RVPGX2 - Abnormal; Notable for the following components:      Result Value   Influenza A by PCR POSITIVE (*)    All other components within normal limits  URINALYSIS, ROUTINE W REFLEX MICROSCOPIC - Abnormal; Notable for the following components:   APPearance HAZY (*)    Hgb urine dipstick LARGE (*)    All other components within normal limits  BASIC METABOLIC PANEL - Abnormal; Notable for the following components:   CO2 20 (*)    Glucose, Bld 100 (*)    All other components within normal limits  HCG, QUANTITATIVE, PREGNANCY - Abnormal; Notable for the following components:   hCG, Beta Chain, Quant, S 48 (*)    All other components within normal limits  URINALYSIS, MICROSCOPIC (REFLEX) - Abnormal; Notable for the following components:   Bacteria, UA RARE (*)    All other components within normal limits  CBC WITH DIFFERENTIAL/PLATELET    EKG None  Radiology US  OB LESS THAN 14 WEEKS WITH OB TRANSVAGINAL Result Date: 12/13/2023 CLINICAL DATA:  Vaginal bleeding in 1st trimester pregnancy. EXAM: OBSTETRIC <14 WK US  AND TRANSVAGINAL OB US  TECHNIQUE: Both transabdominal and transvaginal ultrasound examinations were performed for complete evaluation of the gestation as well as the maternal uterus, adnexal regions, and pelvic cul-de-sac. Transvaginal technique was performed to assess early pregnancy. COMPARISON:  None Available. FINDINGS: Intrauterine gestational sac: None Maternal uterus/adnexae: Endometrial thickness measures 3 mm. Both ovaries are normal in appearance. No mass or abnormal free fluid identified. IMPRESSION: Pregnancy of unknown anatomic location (no intrauterine gestational sac or adnexal mass identified). Differential diagnosis includes recent spontaneous abortion, IUP too early to visualize, and non-visualized ectopic pregnancy. Recommend followup of beta-hCG levels, and follow up US  as warranted clinically.  Electronically Signed   By: Norleen DELENA Kil M.D.   On: 12/13/2023 12:25    Procedures Procedures    Medications Ordered in ED Medications  acetaminophen  (TYLENOL ) tablet 650 mg (650 mg Oral Given 12/13/23 1020)    ED Course/ Medical Decision Making/ A&P                                 Medical Decision Making Amount and/or Complexity of Data Reviewed Labs: ordered. Radiology: ordered.  Risk OTC drugs.  This patient presents to the ED for concern of flulike symptoms, vaginal bleeding in pregnancy, this involves an extensive number of treatment options, and is a complaint that carries with it a high risk of complications and morbidity.  Differential diagnosis includes flu, COVID, RSV.  Differential  diagnosis for vaginal bleeding in pregnancy includes subchorionic hemorrhage, inevitable or incomplete abortion  My initial workup includes labs, symptom control  Additional history obtained from: Nursing notes from this visit.  I ordered, reviewed and interpreted labs which include: CBC, BMP, hCG, urinalysis.  No leukocytosis or anemia.  Respiratory panel positive for influenza A.  No Electra derangement or kidney dysfunction.  hCG 48  Afebrile, hemodynamically stable.  38 year old female presenting to the ED for evaluation of flulike symptoms, vaginal bleeding in pregnancy.  Symptoms all began approximately 2 days ago.  Respiratory panel was positive for influenza A.  Likely source of her symptoms.  She reports vaginal spotting with occasional clots as well.  Believes she is potentially [redacted] weeks pregnant based off last menstrual period.  hCG level 48.  Ultrasound does not reveal the pregnancy.  Differential diagnosis includes early pregnancy, early pregnancy loss, ectopic pregnancy.  She is encouraged to follow-up with her gynecologist for repeat hCG and potential ultrasound.  She was encouraged to return with any sudden worsening of her abdominal pain.  She was educated on typical timeline of  symptoms for flu and supportive care.  She was given return precautions.  Stable at discharge.  At this time there does not appear to be any evidence of an acute emergency medical condition and the patient appears stable for discharge with appropriate outpatient follow up. Diagnosis was discussed with patient who verbalizes understanding of care plan and is agreeable to discharge. I have discussed return precautions with patient who verbalizes understanding. Patient encouraged to follow-up with their PCP within 1 week. All questions answered.  Patient's case discussed with Dr. Ellouise who agrees with plan to discharge with follow-up.   Note: Portions of this report may have been transcribed using voice recognition software. Every effort was made to ensure accuracy; however, inadvertent computerized transcription errors may still be present.         Final Clinical Impression(s) / ED Diagnoses Final diagnoses:  Influenza A  Vaginal bleeding in pregnancy    Rx / DC Orders ED Discharge Orders     None         Edwardo Marsa HERO, PA-C 12/13/23 1252    Ellouise Fine K, DO 12/13/23 1520

## 2023-12-13 NOTE — ED Notes (Signed)
Pt ambulated to bathroom for urine specimen without assistance or incident.

## 2023-12-13 NOTE — Discharge Instructions (Signed)
 You have been seen today for your complaint of flulike symptoms, vaginal bleeding. Your lab work showed an hCG level of 48 which correlates to a pregnancy of less than 1 week.  Respiratory panel positive for influenza A. Your imaging did not show a pregnancy at this time. Follow up with: Your gynecologist.  You need to have your hCG level rechecked.  Call to schedule follow-up appointment. Please seek immediate medical care if you develop any of the following symptoms: You become short of breath or have trouble breathing. Your skin or nails turn blue. You have very bad pain or stiffness in your neck. You get a sudden headache or pain in your face or ear. You vomit each time you eat or drink. At this time there does not appear to be the presence of an emergent medical condition, however there is always the potential for conditions to change. Please read and follow the below instructions.  Do not take your medicine if  develop an itchy rash, swelling in your mouth or lips, or difficulty breathing; call 911 and seek immediate emergency medical attention if this occurs.  You may review your lab tests and imaging results in their entirety on your MyChart account.  Please discuss all results of fully with your primary care provider and other specialist at your follow-up visit.  Note: Portions of this text may have been transcribed using voice recognition software. Every effort was made to ensure accuracy; however, inadvertent computerized transcription errors may still be present.

## 2023-12-13 NOTE — ED Triage Notes (Signed)
C/o flu like symptoms, fever, cough, body aches, headache the past few days. Taking childrens tylenol. [redacted] weeks pregnant. Vaginal bleeding started 2 days ago with clots starting yesterday. Abdominal cramping.

## 2023-12-13 NOTE — ED Notes (Signed)
Last menstrual cycle Dec 24th 2024. Cramping and pelvic/bladder region pain. No recent trauma. 2x days of abd pain with spotting, and flu symptoms.

## 2024-03-09 ENCOUNTER — Other Ambulatory Visit: Payer: Self-pay
